# Patient Record
Sex: Male | Born: 1975 | Race: White | Hispanic: No | Marital: Married | Smoking: Former smoker
Health system: Southern US, Community
[De-identification: ages and names within clinical notes are randomized; demographics above are authoritative.]

## PROBLEM LIST (undated history)

## (undated) DIAGNOSIS — E119 Type 2 diabetes mellitus without complications: Secondary | ICD-10-CM

## (undated) DIAGNOSIS — I10 Essential (primary) hypertension: Secondary | ICD-10-CM

## (undated) HISTORY — DX: Type 2 diabetes mellitus without complications: E11.9

## (undated) HISTORY — PX: WISDOM TOOTH EXTRACTION: SHX21

## (undated) HISTORY — DX: Essential (primary) hypertension: I10

## (undated) HISTORY — PX: WRIST SURGERY: SHX841

---

## 1998-11-07 ENCOUNTER — Emergency Department (HOSPITAL_COMMUNITY): Admission: EM | Admit: 1998-11-07 | Discharge: 1998-11-07 | Payer: Self-pay | Admitting: Emergency Medicine

## 2013-09-11 ENCOUNTER — Ambulatory Visit (INDEPENDENT_AMBULATORY_CARE_PROVIDER_SITE_OTHER): Payer: BC Managed Care – PPO | Admitting: Internal Medicine

## 2013-09-11 VITALS — BP 154/80 | HR 79 | Temp 98.1°F | Resp 16 | Ht 70.0 in | Wt 219.4 lb

## 2013-09-11 DIAGNOSIS — IMO0002 Reserved for concepts with insufficient information to code with codable children: Secondary | ICD-10-CM

## 2013-09-11 DIAGNOSIS — T148 Other injury of unspecified body region: Secondary | ICD-10-CM

## 2013-09-11 DIAGNOSIS — W57XXXA Bitten or stung by nonvenomous insect and other nonvenomous arthropods, initial encounter: Secondary | ICD-10-CM

## 2013-09-11 DIAGNOSIS — L02412 Cutaneous abscess of left axilla: Secondary | ICD-10-CM

## 2013-09-11 MED ORDER — FLUOCINONIDE 0.05 % EX CREA: 1.0000 "application " | TOPICAL_CREAM | Freq: Two times a day (BID) | CUTANEOUS | Status: DC

## 2013-09-11 MED ORDER — DOXYCYCLINE HYCLATE 100 MG PO TABS: 100.0000 mg | ORAL_TABLET | Freq: Two times a day (BID) | ORAL | Status: DC

## 2013-09-11 NOTE — Progress Notes (Signed)
   Subjective:    Patient ID: TAEDYN GLASSCOCK, male    DOB: 1975-02-10, 38 y.o.   MRN: [id number] This chart was scribed for Tami Lin, MD by Randa Evens, ED Scribe. This Patient was seen in room 02 and the patients care was started at 4:58 PM  HPI  Chief Complaint  Patient presents with  . Rash    pt noticed possible chicken pox this morning;  covering stomach, left leg and right arm  . Insect Bite    x2 months ago;  pt states he believe he has an insect bite under his left armpit/side;  redness/tender to the touch   HPI Comments: KENDON SEDENO is a 38 y.o. male who presents to the [facility] complaining of rash onset 1 day prior. The rash is located on his left foot and abdomen.  He states that he believes that the rash may be chicken pox. He describes the rash as itchy. He states that he has recently been in the woods and in his garden. Denies fever.   Also complaining of insect bite to his left upper back several months prior. He states that it was infected initially and his wife drained it and it resolved. He states that the abscess has recently returned. He states he has stabbed it with a needle with no relief.   There are no active problems to display for this patient.  Prior to Admission medications   Not on File   Review of Systems  Constitutional: Negative for fever.  Skin: Positive for rash.    Objective:    Physical Exam  Nursing note and vitals reviewed. Constitutional: He is oriented to person, place, and time. He appears well-developed and well-nourished. No distress.  HENT:  Head: Normocephalic and atraumatic.  Eyes: Conjunctivae and EOM are normal.  Neck: Neck supple.  Cardiovascular: Normal rate.   Pulmonary/Chest: Effort normal. No respiratory distress.  Musculoskeletal: Normal range of motion.  Neurological: He is alert and oriented to person, place, and time.  Skin: Skin is warm and dry. Rash noted. Rash is vesicular.   Multiple vesicular lesion scattered over periumbilical and left ankle without any scabs or erythema,  Under left axilla 1x2cm abscess that is tender  Psychiatric: He has a normal mood and affect. His behavior is normal.   Procedure-open the abscess with an 18-gauge needle and drained pus and blood obtaining a culture Assessment & Plan:   Abscess of axilla, left - Plan: Wound culture  Insect bites  Chiggers  Meds ordered this encounter  Medications  . doxycycline (VIBRA-TABS) 100 MG tablet    Sig: Take 1 tablet (100 mg total) by mouth 2 (two) times daily.    Dispense:  20 tablet    Refill:  0  . fluocinonide cream (LIDEX) 0.05 %    Sig: Apply 1 application topically 2 (two) times daily.    Dispense:  30 g    Refill:  0       I have completed the patient encounter in its entirety as documented by the scribe, with editing by me where necessary. Cozetta Seif P. Laney Pastor, M.D.

## 2013-09-14 LAB — WOUND CULTURE
GRAM STAIN: NONE SEEN
Gram Stain: NONE SEEN

## 2013-09-27 ENCOUNTER — Telehealth: Payer: Self-pay

## 2013-09-27 NOTE — Telephone Encounter (Signed)
Patient finished his antibiotic medication for an insect bite from 09/11/13. Per patient his left arm seem to be getting better but today he noticed it was red again and swelling. Patient feels he needs another round of antibiotics to completely clear it up. Patient requesting Dr. Laney Pastor to call it in to [facility] on [address]. Patients call back number is [phone]

## 2013-09-28 ENCOUNTER — Ambulatory Visit (INDEPENDENT_AMBULATORY_CARE_PROVIDER_SITE_OTHER): Payer: BC Managed Care – PPO | Admitting: Internal Medicine

## 2013-09-28 ENCOUNTER — Telehealth: Payer: Self-pay

## 2013-09-28 VITALS — BP 146/100 | HR 78 | Temp 98.3°F | Resp 16 | Ht 70.5 in | Wt 223.8 lb

## 2013-09-28 DIAGNOSIS — L02412 Cutaneous abscess of left axilla: Secondary | ICD-10-CM

## 2013-09-28 DIAGNOSIS — I10 Essential (primary) hypertension: Secondary | ICD-10-CM | POA: Insufficient documentation

## 2013-09-28 DIAGNOSIS — IMO0002 Reserved for concepts with insufficient information to code with codable children: Secondary | ICD-10-CM

## 2013-09-28 DIAGNOSIS — Z6831 Body mass index (BMI) 31.0-31.9, adult: Secondary | ICD-10-CM

## 2013-09-28 MED ORDER — HYDROCHLOROTHIAZIDE 12.5 MG PO CAPS: 12.5000 mg | ORAL_CAPSULE | Freq: Every day | ORAL | Status: DC

## 2013-09-28 MED ORDER — SULFAMETHOXAZOLE-TMP DS 800-160 MG PO TABS: 1.0000 | ORAL_TABLET | Freq: Two times a day (BID) | ORAL | Status: DC

## 2013-09-28 NOTE — Progress Notes (Signed)
   Subjective:    Patient ID: Jesus Roy, male    DOB: February 22, 1975, 38 y.o.   MRN: [id number]  HPI  Jesus Roy is a 38 y.o. male who presents to [facility] for a follow-up to a suspected abscess of his left distal axilla.   Jesus Roy was treated with doxycyline 18 days ago on the 10th of August for an abscess; the culture with unrevealing. He responded to the doxycycline, but he finished the course a week ago and he is now relapsing. He reports the redness associated with the site had greatly improved prior to worsening. The affected site is also associated with pain and swelling.   The pt denies a spider bite or foreign body puncture.   He voices an allergy to penicillin.   When asked about the presence of hypertension on today's visit, Jesus Roy reports he measures his BP at work, and he states the BP is often high. In the office his BP is 146/100 today. He does not take medication to treat any HTN. He endorses early-morning headaches. His father has significant hypertension He has no chest pain palpitations shortness of breath or edema  He is a current every day smoker  Review of Systems  Constitutional: Negative for fever and chills.   all other systems are Otherwise negative, see HPI.      Objective:   Physical Exam  Nursing note and vitals reviewed. Constitutional: He is oriented to person, place, and time. He appears well-developed and well-nourished. No distress.  HENT:  Head: Normocephalic and atraumatic.  Eyes: Conjunctivae and EOM are normal.  Neck: Neck supple. No tracheal deviation present.  Cardiovascular: Normal rate.  Regular without murmur.  Pulmonary/Chest: Effort normal. No respiratory distress.  Musculoskeletal: Normal range of motion. No peripheral edema.  Neurological: He is alert and oriented to person, place, and time.  Skin: Skin is warm and dry. There is a 1 cm erythematous nodule with central purple discoloration and slight fluctuance.   Psychiatric: He has a normal mood and affect. His behavior is normal.  Vitals: BP 146/100  Pulse 78  Temp(Src) 98.3 F (36.8 C) (Oral)  Resp 16  Ht 5' 10.5" (1.791 m)  Wt 223 lb 12.8 oz (101.515 kg)  BMI 31.65 kg/m2  SpO2 98%      Assessment & Plan:  Will prescribe antibiotic and HTN medication.  I have completed the patient encounter in its entirety as documented by the scribe, with editing by me where necessary. Khair Chasteen P. Laney Pastor, M.D. Abscess of axilla, left  Relapse after partial response to treatment  Will treat with hot compresses and one month of Septra/original culture with staph but not MRSA Essential hypertension  New diagnosis BMI 31.0-31.9,adult  Wt loss/salt restr Nicotine addiction  We will work on cessation plan at followup Meds ordered this encounter  Medications  . sulfamethoxazole-trimethoprim (BACTRIM DS) 800-160 MG per tablet    Sig: Take 1 tablet by mouth 2 (two) times daily.    Dispense:  60 tablet    Refill:  0  . hydrochlorothiazide (MICROZIDE) 12.5 MG capsule////continue home blood pressures     Sig: Take 1 capsule (12.5 mg total) by mouth daily.    Dispense:  90 capsule    Refill:  0   Cpe/htn f/u 3 mos 104

## 2013-09-28 NOTE — Telephone Encounter (Signed)
Duplicate phone message 

## 2013-09-28 NOTE — Telephone Encounter (Signed)
Pt called in again concerned about his bite still being infected. He would like someone to call him back advising him what he needs to do. [phone] Thank you

## 2013-09-28 NOTE — Telephone Encounter (Signed)
Spoke to pt advised he needed to RTC for re evaluation

## 2013-10-26 ENCOUNTER — Telehealth: Payer: Self-pay

## 2013-10-26 NOTE — Telephone Encounter (Signed)
Pt advised to RTC.

## 2013-10-26 NOTE — Telephone Encounter (Signed)
Pt was here 09/28/13 and was prescribed an antibiotic and stopped taking them before they were gone. Then he thought the infection was coming back so he started taking the medication again and had what he thinks is an allergic reaction to the antibiotics. He wants someone to call him please!

## 2013-12-26 ENCOUNTER — Encounter: Payer: Self-pay | Admitting: Internal Medicine

## 2013-12-31 ENCOUNTER — Ambulatory Visit (INDEPENDENT_AMBULATORY_CARE_PROVIDER_SITE_OTHER): Payer: BC Managed Care – PPO | Admitting: Internal Medicine

## 2013-12-31 ENCOUNTER — Encounter: Payer: Self-pay | Admitting: Internal Medicine

## 2013-12-31 VITALS — BP 140/90 | HR 85 | Resp 12 | Ht 70.5 in | Wt 227.0 lb

## 2013-12-31 DIAGNOSIS — I10 Essential (primary) hypertension: Secondary | ICD-10-CM

## 2013-12-31 DIAGNOSIS — Z0000 Encounter for general adult medical examination without abnormal findings: Secondary | ICD-10-CM

## 2013-12-31 DIAGNOSIS — Z6832 Body mass index (BMI) 32.0-32.9, adult: Secondary | ICD-10-CM

## 2013-12-31 DIAGNOSIS — Z720 Tobacco use: Secondary | ICD-10-CM

## 2013-12-31 DIAGNOSIS — Z87891 Personal history of nicotine dependence: Secondary | ICD-10-CM

## 2013-12-31 DIAGNOSIS — E669 Obesity, unspecified: Secondary | ICD-10-CM

## 2013-12-31 DIAGNOSIS — E781 Pure hyperglyceridemia: Secondary | ICD-10-CM

## 2013-12-31 LAB — POCT URINALYSIS DIPSTICK
BILIRUBIN UA: NEGATIVE
Ketones, UA: NEGATIVE
Leukocytes, UA: NEGATIVE
Nitrite, UA: NEGATIVE
Spec Grav, UA: >=1.03
Urobilinogen, UA: NEGATIVE
pH, UA: 6

## 2013-12-31 MED ORDER — HYDROCHLOROTHIAZIDE 25 MG PO TABS: 25.0000 mg | ORAL_TABLET | Freq: Every day | ORAL | Status: DC

## 2013-12-31 MED ORDER — LOSARTAN POTASSIUM 50 MG PO TABS: 50.0000 mg | ORAL_TABLET | Freq: Every day | ORAL | Status: DC

## 2013-12-31 NOTE — Patient Instructions (Addendum)
Increase HCTZ to 25 mg daily and Losartan 50 mg daily. RTC in 3 weeks. He will need a basic metabolic panel with that time and blood pressure check with office visit. He says he will get flu vaccine at work.

## 2013-12-31 NOTE — Progress Notes (Signed)
   Subjective:    Patient ID: Jesus Roy, male    DOB: 04-Jan-1976, 38 y.o.   MRN: [id number]  HPI  38 year old White Male not seen here since 2008 with history of allergic rhinitis and attention deficit disorder who was seen at [facility] by Dr. Laney Pastor in August. He was diagnosed with hypertension and was put on HCTZ 12.5 mg daily which he just ran out of today. Patient is here for follow-up. His blood pressure is still not well controlled. In 2008 his blood pressure was 120/80 and he weighed 195 pounds. At that time he was taking Adderall XR 20 mg daily for attention deficit. He says insurance subsequently refused to pay for Adderall. He would like to get back on it but I'm reluctant to start him back on it with history of hypertension.  Past medical history: He is allergic penicillin causes a rash. He had a fractured left radial head in 1992, ganglion cyst removed from left wrist 2005, left foot surgery by podiatrist in February 1999.  Family history: Father with history of hypertension and hyperlipidemia. One brother in good health. Mother in good health.  Social history: He is married. He works at [employer] in [employer]. Doesn't get a lot of physical exercise. He has smoked for 15 years. Denies alcohol consumption. One stepson age 62.    Review of Systems  Constitutional: Negative.   All other systems reviewed and are negative.      Objective:   Physical Exam  Constitutional: He is oriented to person, place, and time. He appears well-developed and well-nourished. He appears distressed.  HENT:  Head: Normocephalic and atraumatic.  Right Ear: External ear normal.  Left Ear: External ear normal.  Mouth/Throat: Oropharynx is clear and moist. No oropharyngeal exudate.  Eyes: Conjunctivae and EOM are normal. Pupils are equal, round, and reactive to light. Right eye exhibits no discharge. Left eye exhibits no discharge. No scleral icterus.  Neck:  Neck supple. No JVD present. No thyromegaly present.  Cardiovascular: Normal rate, normal heart sounds and intact distal pulses.   No murmur heard. Pulmonary/Chest: Effort normal. He has no wheezes. He has no rales.  Abdominal: Soft. He exhibits no mass. There is no tenderness. There is no rebound and no guarding.  Genitourinary:  Testicles without masses. No hernias.  Musculoskeletal: He exhibits no edema.  Lymphadenopathy:    He has no cervical adenopathy.  Neurological: He is alert and oriented to person, place, and time. He has normal reflexes. No cranial nerve deficit.  Skin: Skin is warm and dry. No rash noted. He is not diaphoretic.  Multiple tattoos on both arms and back  Psychiatric: He has a normal mood and affect. His behavior is normal. Judgment normal.  Vitals reviewed.         Assessment & Plan:  Essential hypertension  History of smoking  Plan: Increase HCTZ to 25 mg daily. Start losartan 50 mg daily. Return in 3 weeks at which time he'll need office visit blood pressure check and basic metabolic panel. He did have a health screening at work this past year. Total cholesterol was 203 with an HDL cholesterol of 46, triglycerides of 195 and an LDL cholesterol of 118. Glucose was normal at 94. Blood pressure was 128/86. BMI was 32.1. Waist measurement was 43 inches. Patient was advised to diet exercise and lose 20 pounds. He needs to quit smoking.

## 2014-01-22 ENCOUNTER — Encounter: Payer: Self-pay | Admitting: Internal Medicine

## 2014-01-22 ENCOUNTER — Ambulatory Visit (INDEPENDENT_AMBULATORY_CARE_PROVIDER_SITE_OTHER): Payer: BC Managed Care – PPO | Admitting: Internal Medicine

## 2014-01-22 VITALS — BP 108/80 | HR 73 | Temp 97.6°F | Wt 234.0 lb

## 2014-01-22 DIAGNOSIS — Z0000 Encounter for general adult medical examination without abnormal findings: Secondary | ICD-10-CM

## 2014-01-22 DIAGNOSIS — Z0189 Encounter for other specified special examinations: Secondary | ICD-10-CM

## 2014-01-22 LAB — BASIC METABOLIC PANEL
BUN: 15 mg/dL (ref 6–23)
CALCIUM: 9.7 mg/dL (ref 8.4–10.5)
CHLORIDE: 102 meq/L (ref 96–112)
CO2: 29 meq/L (ref 19–32)
CREATININE: 0.85 mg/dL (ref 0.50–1.35)
Glucose, Bld: 125 mg/dL — ABNORMAL HIGH (ref 70–99)
Potassium: 4.3 mEq/L (ref 3.5–5.3)
Sodium: 138 mEq/L (ref 135–145)

## 2014-01-23 ENCOUNTER — Other Ambulatory Visit: Payer: Self-pay | Admitting: Internal Medicine

## 2014-01-24 ENCOUNTER — Other Ambulatory Visit: Payer: Self-pay | Admitting: *Deleted

## 2014-01-24 MED ORDER — HYDROCHLOROTHIAZIDE 25 MG PO TABS: 25.0000 mg | ORAL_TABLET | Freq: Every day | ORAL | Status: DC

## 2014-01-24 MED ORDER — LOSARTAN POTASSIUM 50 MG PO TABS: 50.0000 mg | ORAL_TABLET | Freq: Every day | ORAL | Status: DC

## 2014-01-24 NOTE — Telephone Encounter (Signed)
Patient called states his pharmacy states they have not received refills on his medications sent authorization in on both of his BP meds

## 2014-03-27 ENCOUNTER — Encounter: Payer: Self-pay | Admitting: Internal Medicine

## 2014-03-27 NOTE — Patient Instructions (Signed)
Continue HCTZ and Cozaar 50 mg daily. Return in 6 months. Quit smoking.

## 2014-03-27 NOTE — Progress Notes (Signed)
   Subjective:    Patient ID: Jesus Roy, male    DOB: April 16, 1975, 40 y.o.   MRN: [id number]  HPI  He was here in late November for physical exam and evaluation of hypertension. At that time he was on mild dose of HCTZ 12.5 mg daily. This was increased to 25 mg daily and he was started on losartan 50 mg daily. Blood pressure has improved considerably with this regimen. He feels well with no complaints. Basic metabolic panel drawn today. He also has a history of hypertriglyceridemia diagnosed last visit has been told to diet exercise and lose weight.   Review of Systems     Objective:   Physical Exam  Skin warm and dry. Nodes none. Neck is supple without JVD thyromegaly or carotid bruits. Chest clear to auscultation. Extremities without edema      Assessment & Plan:   Essential hypertension  Hypertriglyceridemia  History of smoking-counseled regarding quitting  Plan: Continue HCTZ 25 mg and losartan 50 mg daily. Return in 6 months.

## 2014-05-13 ENCOUNTER — Ambulatory Visit (INDEPENDENT_AMBULATORY_CARE_PROVIDER_SITE_OTHER): Payer: BLUE CROSS/BLUE SHIELD | Admitting: Internal Medicine

## 2014-05-13 ENCOUNTER — Encounter: Payer: Self-pay | Admitting: Internal Medicine

## 2014-05-13 DIAGNOSIS — R1010 Upper abdominal pain, unspecified: Secondary | ICD-10-CM | POA: Diagnosis not present

## 2014-05-13 LAB — AMYLASE: Amylase: 20 U/L (ref 0–105)

## 2014-05-13 LAB — CBC WITH DIFFERENTIAL/PLATELET
HEMATOCRIT: 48 % (ref 39.0–52.0)
HEMOGLOBIN: 16.5 g/dL (ref 13.0–17.0)
LYMPHS ABS: 3.4 10*3/uL (ref 0.7–4.0)
Lymphocytes Relative: 40 % (ref 12–46)
MCH: 28.7 pg (ref 26.0–34.0)
MCHC: 34.4 g/dL (ref 30.0–36.0)
MCV: 83.6 fL (ref 78.0–100.0)
MONO ABS: 0.5 10*3/uL (ref 0.1–1.0)
MONOS PCT: 6 % (ref 3–12)
NEUTROS PCT: 55 % (ref 43–77)
Neutro Abs: 4.7 10*3/uL (ref 1.7–7.7)
Platelets: 302 10*3/uL (ref 150–400)
RBC: 5.74 MIL/uL (ref 4.22–5.81)
RDW: 12.9 % (ref 11.5–15.5)
WBC: 8.5 10*3/uL (ref 4.0–10.5)

## 2014-05-13 LAB — POCT URINALYSIS DIPSTICK
Bilirubin, UA: NEGATIVE
Blood, UA: NEGATIVE
Glucose, UA: NEGATIVE
KETONES UA: NEGATIVE
LEUKOCYTES UA: NEGATIVE
Nitrite, UA: NEGATIVE
PROTEIN UA: NEGATIVE
SPEC GRAV UA: 1.015
Urobilinogen, UA: NEGATIVE
pH, UA: 6.5

## 2014-05-13 LAB — LIPASE: LIPASE: 22 U/L (ref 0–75)

## 2014-05-13 LAB — HEPATIC FUNCTION PANEL
ALBUMIN: 4.4 g/dL (ref 3.5–5.2)
ALT: 60 U/L — ABNORMAL HIGH (ref 0–53)
AST: 35 U/L (ref 0–37)
Alkaline Phosphatase: 90 U/L (ref 39–117)
Bilirubin, Direct: 0.1 mg/dL (ref 0.0–0.3)
Indirect Bilirubin: 0.4 mg/dL (ref 0.2–1.2)
TOTAL PROTEIN: 6.9 g/dL (ref 6.0–8.3)
Total Bilirubin: 0.5 mg/dL (ref 0.2–1.2)

## 2014-05-13 MED ORDER — MELOXICAM 15 MG PO TABS: 15.0000 mg | ORAL_TABLET | Freq: Every day | ORAL | Status: DC

## 2014-05-13 NOTE — Progress Notes (Signed)
   Subjective:    Patient ID: Jesus Roy, male    DOB: [birth year], 39 y.o.   MRN: [id number]  HPI  Onset Friday of right lateral abdominal pain. This is to the right of the upper quadrant but not the axillary line. No vomiting fever or chills or nausea. No diarrhea. Patient doesn't recall anything other than regular activity when he had onset of pain. Noted pain when he was coughing or laughing but no other time. He took some Advil and got some relief. On Saturday he was fine. However yesterday he mowed the yard and had recurrence of the pain. Said wife was concerned about gallbladder attack.  He is overweight and has a history of hypertension. He has a history of smoking.    Review of Systems     Objective:   Physical Exam  He has palpable tenderness along the right lateral lower rib cage area. There is point tenderness. He has no rebound tenderness in the right upper quadrant. Abdomen is soft and nondistended without hepatosplenomegaly. Urinalysis is unremarkable. CBC with differential, liver functions, amylase and lipase pending. Chest is clear. Pulse oximetry is normal at 98%.      Assessment & Plan:  Probable musculoskeletal pain right rib cage area lateral aspect  Plan: Mobic 15 mg daily for 7-10 days. Call if symptoms worsen or do not improve.  25 minutes spent with patient.

## 2014-05-13 NOTE — Patient Instructions (Signed)
Suspect this is musculoskeletal pain since it occurs with coughing and laughing. Take Mobitz 15 mg daily for 7-10 days. Call if symptoms worsen. Have checked liver functions, amylase and lipase in addition to CBC with differential.

## 2014-05-14 ENCOUNTER — Telehealth: Payer: Self-pay | Admitting: *Deleted

## 2014-05-14 NOTE — Telephone Encounter (Signed)
Reviewed lab results with patient.

## 2014-07-26 ENCOUNTER — Ambulatory Visit: Payer: BC Managed Care – PPO | Admitting: Internal Medicine

## 2014-08-01 ENCOUNTER — Ambulatory Visit (INDEPENDENT_AMBULATORY_CARE_PROVIDER_SITE_OTHER): Payer: BLUE CROSS/BLUE SHIELD | Admitting: Internal Medicine

## 2014-08-01 ENCOUNTER — Encounter: Payer: Self-pay | Admitting: Internal Medicine

## 2014-08-01 VITALS — BP 132/84 | HR 92 | Temp 97.2°F | Wt 228.0 lb

## 2014-08-01 DIAGNOSIS — Z23 Encounter for immunization: Secondary | ICD-10-CM

## 2014-08-01 DIAGNOSIS — Z283 Underimmunization status: Secondary | ICD-10-CM | POA: Diagnosis not present

## 2014-08-01 DIAGNOSIS — Z2839 Other underimmunization status: Secondary | ICD-10-CM

## 2014-08-01 DIAGNOSIS — Z0000 Encounter for general adult medical examination without abnormal findings: Secondary | ICD-10-CM | POA: Diagnosis not present

## 2014-08-01 DIAGNOSIS — I10 Essential (primary) hypertension: Secondary | ICD-10-CM | POA: Diagnosis not present

## 2014-08-01 MED ORDER — LOSARTAN POTASSIUM-HCTZ 100-25 MG PO TABS: 1.0000 | ORAL_TABLET | Freq: Every day | ORAL | Status: DC

## 2014-08-01 NOTE — Progress Notes (Signed)
   Subjective:    Patient ID: Jesus Roy, male    DOB: [birth year], 39 y.o.   MRN: [id number]  HPI Six-month recheck on hypertension. He needs tetanus immunization update. He's been taking losartan 50 mg daily and HCTZ 25 mg daily. He says drugstore changed brands of losartan and interestingly enough his legs do not hurt anymore when he started the new prescription. I'm not sure why that is. He is certain it was losartan causing leg cramps not HCTZ. His blood pressure is under fairly good control but I would  like to see it closer to 120/80.    Review of Systems     Objective:   Physical Exam  Skin warm and dry. Nodes none. Neck is supple without JVD thyromegaly or carotid bruits. Chest clear to auscultation. Cardiac exam regular rate and rhythm normal S1 and S2. Extremities without edema      Assessment & Plan:  Essential hypertension  Have prescribed losartan HCTZ 100/25 . He will return July 28 for follow-up with office visit blood pressure check and basic metabolic panel.

## 2014-08-01 NOTE — Patient Instructions (Signed)
Change to losartan 100/25 daily. Tetanus immunization update given. Return in 4 weeks for follow-up and basic metabolic panel.

## 2014-08-29 ENCOUNTER — Ambulatory Visit (INDEPENDENT_AMBULATORY_CARE_PROVIDER_SITE_OTHER): Payer: BLUE CROSS/BLUE SHIELD | Admitting: Internal Medicine

## 2014-08-29 ENCOUNTER — Encounter: Payer: Self-pay | Admitting: Internal Medicine

## 2014-08-29 DIAGNOSIS — I10 Essential (primary) hypertension: Secondary | ICD-10-CM

## 2014-08-29 MED ORDER — LOSARTAN POTASSIUM-HCTZ 100-25 MG PO TABS: 1.0000 | ORAL_TABLET | Freq: Every day | ORAL | Status: DC

## 2014-08-29 NOTE — Progress Notes (Signed)
   Subjective:    Patient ID: Jesus Roy, male    DOB: 1975/08/19, 39 y.o.   MRN: [id number]  HPI  In June, he was here for six-month recheck on hypertension. Blood pressure was reasonably well controlled on losartan 50/25 at 132/84 but wanted to see a little bit better control son increased him to losartan 100/25 daily. He's been taking it without any difficulty whatsoever. Basic metabolic panel checked today. Has been monitoring blood pressure at work and says it's running in the 485I systolically and in the 62V diastolically.    Review of Systems     Objective:   Physical Exam  Skin warm and dry. Nodes none. No JVD thyromegaly or carotid bruits. Chest clear to auscultation. Cardiac exam regular rate and rhythm. Extremities without edema.  Family history of hypertension in his father      Assessment & Plan:  Essential hypertension  Plan: Continue losartan 100/50 daily and reevaluate February 2017. Will be due for physical exam at that time. He is to continue to monitor his blood pressure at work.

## 2014-08-29 NOTE — Patient Instructions (Signed)
Continue losartan 100/25 and return in early February for physical exam

## 2014-08-30 ENCOUNTER — Telehealth: Payer: Self-pay | Admitting: *Deleted

## 2014-08-30 LAB — BASIC METABOLIC PANEL
BUN: 13 mg/dL (ref 7–25)
CALCIUM: 9.4 mg/dL (ref 8.6–10.3)
CO2: 20 mEq/L (ref 20–31)
Chloride: 105 mEq/L (ref 98–110)
Creat: 0.74 mg/dL (ref 0.60–1.35)
Glucose, Bld: 112 mg/dL — ABNORMAL HIGH (ref 65–99)
Potassium: 3.5 mEq/L (ref 3.5–5.3)
Sodium: 143 mEq/L (ref 135–146)

## 2014-08-30 MED ORDER — POTASSIUM CHLORIDE CRYS ER 20 MEQ PO TBCR: 20.0000 meq | EXTENDED_RELEASE_TABLET | Freq: Every day | ORAL | Status: DC

## 2014-08-30 NOTE — Telephone Encounter (Signed)
Spoke with patient regarding lab results. 

## 2014-09-23 ENCOUNTER — Other Ambulatory Visit: Payer: BLUE CROSS/BLUE SHIELD | Admitting: Internal Medicine

## 2014-09-23 DIAGNOSIS — I10 Essential (primary) hypertension: Secondary | ICD-10-CM

## 2014-09-23 LAB — BASIC METABOLIC PANEL
BUN: 14 mg/dL (ref 7–25)
CALCIUM: 9.4 mg/dL (ref 8.6–10.3)
CO2: 26 mmol/L (ref 20–31)
CREATININE: 0.88 mg/dL (ref 0.60–1.35)
Chloride: 101 mmol/L (ref 98–110)
GLUCOSE: 115 mg/dL — AB (ref 65–99)
Potassium: 4.2 mmol/L (ref 3.5–5.3)
SODIUM: 139 mmol/L (ref 135–146)

## 2014-10-28 ENCOUNTER — Other Ambulatory Visit: Payer: Self-pay | Admitting: *Deleted

## 2014-10-28 MED ORDER — POTASSIUM CHLORIDE CRYS ER 20 MEQ PO TBCR: 20.0000 meq | EXTENDED_RELEASE_TABLET | Freq: Every day | ORAL | Status: DC

## 2014-10-28 NOTE — Telephone Encounter (Signed)
K refilled

## 2015-02-28 ENCOUNTER — Other Ambulatory Visit: Payer: Self-pay | Admitting: Internal Medicine

## 2015-03-20 ENCOUNTER — Other Ambulatory Visit: Payer: BLUE CROSS/BLUE SHIELD | Admitting: Internal Medicine

## 2015-03-20 DIAGNOSIS — Z0000 Encounter for general adult medical examination without abnormal findings: Secondary | ICD-10-CM

## 2015-03-20 DIAGNOSIS — I10 Essential (primary) hypertension: Secondary | ICD-10-CM

## 2015-03-20 DIAGNOSIS — Z13220 Encounter for screening for lipoid disorders: Secondary | ICD-10-CM

## 2015-03-20 LAB — COMPLETE METABOLIC PANEL WITH GFR
ALT: 60 U/L — AB (ref 9–46)
AST: 31 U/L (ref 10–40)
Albumin: 4.3 g/dL (ref 3.6–5.1)
Alkaline Phosphatase: 109 U/L (ref 40–115)
BUN: 15 mg/dL (ref 7–25)
CO2: 24 mmol/L (ref 20–31)
CREATININE: 0.79 mg/dL (ref 0.60–1.35)
Calcium: 9.3 mg/dL (ref 8.6–10.3)
Chloride: 101 mmol/L (ref 98–110)
GFR, Est African American: >89 mL/min (ref >=60)
GFR, Est Non African American: >89 mL/min (ref >=60)
Glucose, Bld: 97 mg/dL (ref 65–99)
Potassium: 3.8 mmol/L (ref 3.5–5.3)
Sodium: 137 mmol/L (ref 135–146)
Total Bilirubin: 0.5 mg/dL (ref 0.2–1.2)
Total Protein: 6.7 g/dL (ref 6.1–8.1)

## 2015-03-20 LAB — LIPID PANEL
Cholesterol: 200 mg/dL (ref 125–200)
HDL: 39 mg/dL — ABNORMAL LOW (ref >=40)
LDL CALC: 129 mg/dL (ref <130)
Total CHOL/HDL Ratio: 5.1 Ratio — ABNORMAL HIGH (ref <=5.0)
Triglycerides: 161 mg/dL — ABNORMAL HIGH (ref <150)
VLDL: 32 mg/dL — AB (ref <30)

## 2015-03-20 LAB — CBC WITH DIFFERENTIAL/PLATELET
BASOS ABS: 0.1 10*3/uL (ref 0.0–0.1)
Basophils Relative: 1 % (ref 0–1)
EOS ABS: 0.1 10*3/uL (ref 0.0–0.7)
EOS PCT: 2 % (ref 0–5)
HCT: 46.2 % (ref 39.0–52.0)
Hemoglobin: 16.2 g/dL (ref 13.0–17.0)
Lymphocytes Relative: 37 % (ref 12–46)
Lymphs Abs: 2.6 10*3/uL (ref 0.7–4.0)
MCH: 29.1 pg (ref 26.0–34.0)
MCHC: 35.1 g/dL (ref 30.0–36.0)
MCV: 82.9 fL (ref 78.0–100.0)
MPV: 9.8 fL (ref 8.6–12.4)
Monocytes Absolute: 0.6 10*3/uL (ref 0.1–1.0)
Monocytes Relative: 8 % (ref 3–12)
Neutro Abs: 3.7 10*3/uL (ref 1.7–7.7)
Neutrophils Relative %: 52 % (ref 43–77)
PLATELETS: 295 10*3/uL (ref 150–400)
RBC: 5.57 MIL/uL (ref 4.22–5.81)
RDW: 13.7 % (ref 11.5–15.5)
WBC: 7.1 10*3/uL (ref 4.0–10.5)

## 2015-03-25 ENCOUNTER — Ambulatory Visit (INDEPENDENT_AMBULATORY_CARE_PROVIDER_SITE_OTHER): Payer: BLUE CROSS/BLUE SHIELD | Admitting: Internal Medicine

## 2015-03-25 ENCOUNTER — Encounter: Payer: Self-pay | Admitting: Internal Medicine

## 2015-03-25 VITALS — BP 124/84 | HR 89 | Temp 97.6°F | Resp 20 | Ht 71.0 in | Wt 233.0 lb

## 2015-03-25 DIAGNOSIS — E781 Pure hyperglyceridemia: Secondary | ICD-10-CM | POA: Diagnosis not present

## 2015-03-25 DIAGNOSIS — R740 Nonspecific elevation of levels of transaminase and lactic acid dehydrogenase [LDH]: Secondary | ICD-10-CM | POA: Diagnosis not present

## 2015-03-25 DIAGNOSIS — Z720 Tobacco use: Secondary | ICD-10-CM | POA: Diagnosis not present

## 2015-03-25 DIAGNOSIS — Z0000 Encounter for general adult medical examination without abnormal findings: Secondary | ICD-10-CM | POA: Diagnosis not present

## 2015-03-25 DIAGNOSIS — I10 Essential (primary) hypertension: Secondary | ICD-10-CM | POA: Diagnosis not present

## 2015-03-25 DIAGNOSIS — E669 Obesity, unspecified: Secondary | ICD-10-CM

## 2015-03-25 DIAGNOSIS — Z87891 Personal history of nicotine dependence: Secondary | ICD-10-CM

## 2015-03-25 DIAGNOSIS — G4700 Insomnia, unspecified: Secondary | ICD-10-CM

## 2015-03-25 DIAGNOSIS — R7401 Elevation of levels of liver transaminase levels: Secondary | ICD-10-CM

## 2015-03-25 LAB — POCT URINALYSIS DIPSTICK
BILIRUBIN UA: NEGATIVE
Blood, UA: NEGATIVE
GLUCOSE UA: NEGATIVE
Ketones, UA: NEGATIVE
LEUKOCYTES UA: NEGATIVE
NITRITE UA: NEGATIVE
Protein, UA: NEGATIVE
Spec Grav, UA: 1.02
Urobilinogen, UA: 0.2
pH, UA: 6

## 2015-03-25 MED ORDER — LOSARTAN POTASSIUM-HCTZ 100-25 MG PO TABS: 1.0000 | ORAL_TABLET | Freq: Every day | ORAL | Status: DC

## 2015-03-25 MED ORDER — POTASSIUM CHLORIDE CRYS ER 20 MEQ PO TBCR: 20.0000 meq | EXTENDED_RELEASE_TABLET | Freq: Once | ORAL | Status: DC

## 2015-03-25 MED ORDER — ALPRAZOLAM 0.5 MG PO TBDP: 0.5000 mg | ORAL_TABLET | ORAL | Status: DC

## 2015-04-26 NOTE — Patient Instructions (Addendum)
Would like patient work on diet exercise and weight loss. Return in 6 months for office visit blood pressure check and fasting lipid panel with liver functions. Continue same antihypertensive medications. Takes Xanax sparingly for insomnia. Please quit smoking.

## 2015-04-26 NOTE — Progress Notes (Signed)
Subjective:    Patient ID: Jesus Roy, male    DOB: 12-09-1975, 40 y.o.   MRN: [id number]  HPI 40 year old white male in today for health maintenance exam and evaluation of medical issues. He has a history of allergic rhinitis, attention deficit disorder and essential hypertension. [facility] Laney [facility] with hypertension. He was placed on HCTZ 12.5 mg daily and subsequently we increase that to 25 mg daily and placed him on Cozaar as well. I did not restart Adderall because of his hypertension.  Past medical history: Fractured left radial head in 1992, ganglion cyst removed from left wrist 2005, left foot surgery by podiatrist February 1999.  He is allergic to Penicillin- it causes a rash.  Family history: Father with history of hypertension and hyperlipidemia. One brother in good health. Mother in good health.  Social history: He is married. He works at [employer] in [employer]. Doesn't get a lot of physical exercise. His smoke for 15 years. Denies alcohol consumption. One stepson age 67.    Review of Systems has some occasional insomnia. Have provided him with small prescription of Xanax     Objective:   Physical Exam  Constitutional: He is oriented to person, place, and time. He appears well-developed and well-nourished. No distress.  HENT:  Head: Normocephalic and atraumatic.  Right Ear: External ear normal.  Left Ear: External ear normal.  Mouth/Throat: Oropharynx is clear and moist. No oropharyngeal exudate.  Eyes: Conjunctivae are normal. Pupils are equal, round, and reactive to light. Right eye exhibits no discharge. Left eye exhibits no discharge. No scleral icterus.  Neck: Neck supple. No JVD present. No thyromegaly present.  Cardiovascular: Normal rate, regular rhythm, normal heart sounds and intact distal pulses.   No murmur heard. Pulmonary/Chest: Effort normal and breath sounds normal. No respiratory  distress. He has no wheezes. He has no rales. He exhibits no tenderness.  Abdominal: Soft. Bowel sounds are normal. He exhibits no distension and no mass. There is no tenderness. There is no rebound and no guarding.  Genitourinary:  No hernia to direct palpation  Musculoskeletal: He exhibits no edema.  Lymphadenopathy:    He has no cervical adenopathy.  Neurological: He is alert and oriented to person, place, and time. He has normal reflexes. No cranial nerve deficit. Coordination normal.  Skin: Skin is warm and dry. No rash noted. He is not diaphoretic.  Psychiatric: He has a normal mood and affect. His behavior is normal. Judgment and thought content normal.  Vitals reviewed.         Assessment & Plan:  Essential hypertension-stable on current regimen of losartan/HCTZ 100/25. Continue same medication.  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 Needs to work on diet and exercise. Likely has fatty liver. Recheck in 6 months  History smoking-needs to quit  Insomnia-small prescription of Xanax to take sparingly for insomnia on a when necessary basis only  History attention deficit disorder-currently not on medication  Plan: Return in 6 months for follow-up will need fasting lipid panel, and C met to follow-up on elevated SGPT, potassium, lipids in addition to blood pressure check. Work on diet exercise and weight loss.

## 2015-06-25 ENCOUNTER — Telehealth: Payer: Self-pay | Admitting: Internal Medicine

## 2015-06-25 NOTE — Telephone Encounter (Signed)
Give one refill. Does not want dissolvable tablet

## 2015-06-25 NOTE — Telephone Encounter (Signed)
Calling to get a refill on his Xanax .0.5 mg.  However, can he get the normal instead of the dissolvable pill?  He was trying this from his visit in February.  And, states this does help him with his sleep prn.  He would like to get a refill please.    Thank you.

## 2015-06-26 ENCOUNTER — Other Ambulatory Visit: Payer: Self-pay

## 2015-06-26 MED ORDER — ALPRAZOLAM 0.5 MG PO TABS: 0.5000 mg | ORAL_TABLET | Freq: Every evening | ORAL | Status: DC | PRN

## 2015-09-25 ENCOUNTER — Encounter: Payer: Self-pay | Admitting: Internal Medicine

## 2015-09-25 ENCOUNTER — Ambulatory Visit (INDEPENDENT_AMBULATORY_CARE_PROVIDER_SITE_OTHER): Payer: BLUE CROSS/BLUE SHIELD | Admitting: Internal Medicine

## 2015-09-25 VITALS — BP 132/76 | HR 86 | Temp 98.0°F | Ht 71.0 in | Wt 227.0 lb

## 2015-09-25 DIAGNOSIS — Z87891 Personal history of nicotine dependence: Secondary | ICD-10-CM

## 2015-09-25 DIAGNOSIS — Z720 Tobacco use: Secondary | ICD-10-CM | POA: Diagnosis not present

## 2015-09-25 DIAGNOSIS — G4700 Insomnia, unspecified: Secondary | ICD-10-CM

## 2015-09-25 DIAGNOSIS — E669 Obesity, unspecified: Secondary | ICD-10-CM | POA: Diagnosis not present

## 2015-09-25 DIAGNOSIS — I10 Essential (primary) hypertension: Secondary | ICD-10-CM | POA: Diagnosis not present

## 2015-09-25 MED ORDER — POTASSIUM CHLORIDE CRYS ER 20 MEQ PO TBCR: 20.0000 meq | EXTENDED_RELEASE_TABLET | Freq: Once | ORAL | 1 refills | Status: DC

## 2015-09-25 MED ORDER — LOSARTAN POTASSIUM-HCTZ 100-25 MG PO TABS: 1.0000 | ORAL_TABLET | Freq: Every day | ORAL | 1 refills | Status: DC

## 2015-09-25 MED ORDER — ALPRAZOLAM 0.5 MG PO TABS: 0.5000 mg | ORAL_TABLET | Freq: Every evening | ORAL | 1 refills | Status: DC | PRN

## 2015-09-25 NOTE — Progress Notes (Signed)
   Subjective:    Patient ID: Jesus Roy, male    DOB: 1976-01-27, 40 y.o.   MRN: [id number]  HPI 40  year old Male with HTN for 6 month recheck.Continues to smoke. Not willing to quit. History of hypertension and insomnia. Needs refill on  medications. No new complaints. Remains overweight.Takes Xanax sparingly for sleep at times.    Review of Systems no new complaints     Objective:   Physical Exam  Skin warm and dry. Nodes none. Neck is supple without JVD thyromegaly or carotid bruits. Chest clear to auscultation. Cardiac exam: regular rate and rhythm. Extremities without edema      Assessment & Plan:  Essential hypertension-stable on current regimen  Insomnia-refill Xanax  History of smoking-not willing to quit  Obesity-continue diet and exercise efforts  Plan: Due for physical exam in February 2018.  25 minutes spent with patient.

## 2015-09-26 NOTE — Patient Instructions (Signed)
Please consider quitting smoking. Diet and exercise recommended. Continue same antihypertensive medication. Return in 6 months. Xanax refilled.

## 2016-03-22 ENCOUNTER — Other Ambulatory Visit: Payer: BLUE CROSS/BLUE SHIELD | Admitting: Internal Medicine

## 2016-03-24 ENCOUNTER — Telehealth: Payer: Self-pay | Admitting: Internal Medicine

## 2016-03-24 NOTE — Telephone Encounter (Signed)
Patient has cancelled his CPE for 03/25/16 and didn't R/S at this time.  Per Dr. Renold Genta, no refills without an appointment.

## 2016-03-25 ENCOUNTER — Encounter: Payer: BLUE CROSS/BLUE SHIELD | Admitting: Internal Medicine

## 2016-03-29 ENCOUNTER — Other Ambulatory Visit: Payer: Self-pay | Admitting: Internal Medicine

## 2016-05-24 ENCOUNTER — Other Ambulatory Visit: Payer: BLUE CROSS/BLUE SHIELD | Admitting: Internal Medicine

## 2016-05-24 DIAGNOSIS — Z0000 Encounter for general adult medical examination without abnormal findings: Secondary | ICD-10-CM

## 2016-05-24 DIAGNOSIS — E781 Pure hyperglyceridemia: Secondary | ICD-10-CM

## 2016-05-24 LAB — CBC WITH DIFFERENTIAL/PLATELET
BASOS PCT: 0 %
Basophils Absolute: 0 cells/uL (ref 0–200)
EOS ABS: 136 {cells}/uL (ref 15–500)
Eosinophils Relative: 2 %
HEMATOCRIT: 46.6 % (ref 38.5–50.0)
HEMOGLOBIN: 16.5 g/dL (ref 13.2–17.1)
LYMPHS ABS: 2516 {cells}/uL (ref 850–3900)
Lymphocytes Relative: 37 %
MCH: 29.3 pg (ref 27.0–33.0)
MCHC: 35.4 g/dL (ref 32.0–36.0)
MCV: 82.6 fL (ref 80.0–100.0)
MONO ABS: 544 {cells}/uL (ref 200–950)
MPV: 10 fL (ref 7.5–12.5)
Monocytes Relative: 8 %
Neutro Abs: 3604 cells/uL (ref 1500–7800)
Neutrophils Relative %: 53 %
Platelets: 280 10*3/uL (ref 140–400)
RBC: 5.64 MIL/uL (ref 4.20–5.80)
RDW: 13.7 % (ref 11.0–15.0)
WBC: 6.8 10*3/uL (ref 3.8–10.8)

## 2016-05-24 LAB — COMPREHENSIVE METABOLIC PANEL
ALBUMIN: 4.4 g/dL (ref 3.6–5.1)
ALK PHOS: 102 U/L (ref 40–115)
ALT: 50 U/L — AB (ref 9–46)
AST: 32 U/L (ref 10–40)
BILIRUBIN TOTAL: 0.8 mg/dL (ref 0.2–1.2)
BUN: 15 mg/dL (ref 7–25)
CALCIUM: 9.5 mg/dL (ref 8.6–10.3)
CO2: 27 mmol/L (ref 20–31)
Chloride: 102 mmol/L (ref 98–110)
Creat: 0.73 mg/dL (ref 0.60–1.35)
Glucose, Bld: 87 mg/dL (ref 65–99)
Potassium: 3.7 mmol/L (ref 3.5–5.3)
Sodium: 139 mmol/L (ref 135–146)
TOTAL PROTEIN: 6.9 g/dL (ref 6.1–8.1)

## 2016-05-24 LAB — LIPID PANEL
CHOLESTEROL: 205 mg/dL — AB (ref <200)
HDL: 43 mg/dL (ref >40)
LDL Cholesterol: 129 mg/dL — ABNORMAL HIGH (ref <100)
TRIGLYCERIDES: 166 mg/dL — AB (ref <150)
Total CHOL/HDL Ratio: 4.8 Ratio (ref <5.0)
VLDL: 33 mg/dL — ABNORMAL HIGH (ref <30)

## 2016-05-27 ENCOUNTER — Encounter: Payer: Self-pay | Admitting: Internal Medicine

## 2016-05-27 ENCOUNTER — Ambulatory Visit (INDEPENDENT_AMBULATORY_CARE_PROVIDER_SITE_OTHER): Payer: BLUE CROSS/BLUE SHIELD | Admitting: Internal Medicine

## 2016-05-27 VITALS — BP 130/90 | HR 95 | Temp 97.1°F | Ht 69.5 in | Wt 229.0 lb

## 2016-05-27 DIAGNOSIS — Z6832 Body mass index (BMI) 32.0-32.9, adult: Secondary | ICD-10-CM | POA: Diagnosis not present

## 2016-05-27 DIAGNOSIS — E7800 Pure hypercholesterolemia, unspecified: Secondary | ICD-10-CM

## 2016-05-27 DIAGNOSIS — Z0000 Encounter for general adult medical examination without abnormal findings: Secondary | ICD-10-CM | POA: Diagnosis not present

## 2016-05-27 DIAGNOSIS — Z87891 Personal history of nicotine dependence: Secondary | ICD-10-CM | POA: Diagnosis not present

## 2016-05-27 DIAGNOSIS — G4709 Other insomnia: Secondary | ICD-10-CM | POA: Diagnosis not present

## 2016-05-27 DIAGNOSIS — I10 Essential (primary) hypertension: Secondary | ICD-10-CM

## 2016-05-27 DIAGNOSIS — E781 Pure hyperglyceridemia: Secondary | ICD-10-CM | POA: Diagnosis not present

## 2016-05-27 LAB — POCT URINALYSIS DIPSTICK
BILIRUBIN UA: NEGATIVE
Glucose, UA: NEGATIVE
Ketones, UA: NEGATIVE
Leukocytes, UA: NEGATIVE
NITRITE UA: NEGATIVE
PH UA: 6 (ref 5.0–8.0)
Protein, UA: NEGATIVE
RBC UA: NEGATIVE
Spec Grav, UA: 1.025 (ref 1.010–1.025)
UROBILINOGEN UA: 0.2 U/dL

## 2016-05-27 MED ORDER — POTASSIUM CHLORIDE CRYS ER 20 MEQ PO TBCR
20.0000 meq | EXTENDED_RELEASE_TABLET | Freq: Every day | ORAL | 1 refills | Status: DC
Start: 2016-05-27 — End: 2016-12-10

## 2016-05-27 MED ORDER — ALPRAZOLAM 0.5 MG PO TABS: 0.5000 mg | ORAL_TABLET | Freq: Every evening | ORAL | 1 refills | Status: DC | PRN

## 2016-05-27 MED ORDER — ALPRAZOLAM 0.5 MG PO TABS: 0.5000 mg | ORAL_TABLET | Freq: Every evening | ORAL | 5 refills | Status: DC | PRN

## 2016-05-27 MED ORDER — LOSARTAN POTASSIUM-HCTZ 100-25 MG PO TABS: 1.0000 | ORAL_TABLET | Freq: Every day | ORAL | 0 refills | Status: DC

## 2016-05-29 NOTE — Patient Instructions (Signed)
Try to get more exercise and lose some weight. Continue same medications and return in 6 months. Please quit smoking.

## 2016-05-29 NOTE — Progress Notes (Signed)
   Subjective:    Patient ID: Jesus Roy, male    DOB: 1975-12-10, 41 y.o.   MRN: [id number]  HPI 41 year old male in today for health maintenance exam and evaluation of medical issues. He has history of allergic rhinitis, attention deficit disorder and essential hypertension. Was diagnosed by Dr. Laney [facility] August 2015 with hypertension. Currently on losartan HCTZ 100/25 daily. He does take a potassium supplement. Does take some alprazolam to sleep.  Past medical history: Fractured left radial head 1992, ganglion cyst removed from left wrist 2005, left foot surgery by podiatrist February 1999.  He is allergic to penicillin-causes a rash.  Family history: Father with history of hypertension and hyperlipidemia. One brother in good health. Mother in good health.  Social history: He is married. 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 Has smoked for 16 years. Denies alcohol consumption. One stepson age 51.    Review of Systems  Constitutional: Negative.   All other systems reviewed and are negative.  overweight-doesn't get a lot of physical exercise outside of work     Objective:   Physical Exam  Constitutional: He is oriented to person, place, and time. He appears well-developed and well-nourished. No distress.  HENT:  Head: Normocephalic and atraumatic.  Right Ear: External ear normal.  Left Ear: External ear normal.  Mouth/Throat: Oropharynx is clear and moist. No oropharyngeal exudate.  Eyes: Conjunctivae and EOM are normal. Pupils are equal, round, and reactive to light. Right eye exhibits no discharge. Left eye exhibits no discharge. No scleral icterus.  Neck: Neck supple. No JVD present. No thyromegaly present.  Cardiovascular: Normal rate, regular rhythm, normal heart sounds and intact distal pulses.   No murmur heard. Pulmonary/Chest: Breath sounds normal. No respiratory distress. He has no wheezes. He has no rales.  Abdominal: Soft.  Bowel sounds are normal. He exhibits no distension and no mass. There is no tenderness. There is no rebound and no guarding.  Genitourinary:  Genitourinary Comments: Deferred  Musculoskeletal: He exhibits no edema.  Lymphadenopathy:    He has no cervical adenopathy.  Neurological: He is oriented to person, place, and time. He has normal reflexes. Coordination normal.  Skin: Skin is warm and dry. No rash noted. He is not diaphoretic.  Psychiatric: He has a normal mood and affect. His behavior is normal. Judgment and thought content normal.  Vitals reviewed.         Assessment & Plan:  Essential hypertension-stable on current regimen  Insomnia-refill Xanax  Hypokalemia secondary to diuretic therapy-taking potassium supplement  History smoking-once again told him to quit  Hypertriglyceridemia-triglycerides 166  Mild elevation in LDL at 129  Elevated SGPT of 60 likely due to fatty liver  Plan: Return in 6 months for office visit blood pressure check ,lipid panel, C met

## 2016-11-23 ENCOUNTER — Other Ambulatory Visit: Payer: BLUE CROSS/BLUE SHIELD | Admitting: Internal Medicine

## 2016-11-26 ENCOUNTER — Ambulatory Visit: Payer: BLUE CROSS/BLUE SHIELD | Admitting: Internal Medicine

## 2016-11-29 ENCOUNTER — Other Ambulatory Visit: Payer: Self-pay | Admitting: Internal Medicine

## 2016-11-29 NOTE — Telephone Encounter (Signed)
Refill one time only-- has appt soon

## 2016-12-07 ENCOUNTER — Other Ambulatory Visit: Payer: BLUE CROSS/BLUE SHIELD | Admitting: Internal Medicine

## 2016-12-07 DIAGNOSIS — E781 Pure hyperglyceridemia: Secondary | ICD-10-CM

## 2016-12-07 LAB — HEPATIC FUNCTION PANEL
AG Ratio: 1.8 (calc) (ref 1.0–2.5)
ALKALINE PHOSPHATASE (APISO): 101 U/L (ref 40–115)
ALT: 34 U/L (ref 9–46)
AST: 21 U/L (ref 10–40)
Albumin: 4.1 g/dL (ref 3.6–5.1)
BILIRUBIN INDIRECT: 0.2 mg/dL (ref 0.2–1.2)
Bilirubin, Direct: 0.1 mg/dL (ref 0.0–0.2)
GLOBULIN: 2.3 g/dL (ref 1.9–3.7)
TOTAL PROTEIN: 6.4 g/dL (ref 6.1–8.1)
Total Bilirubin: 0.3 mg/dL (ref 0.2–1.2)

## 2016-12-07 LAB — LIPID PANEL
CHOL/HDL RATIO: 7.6 (calc) — AB (ref <5.0)
CHOLESTEROL: 205 mg/dL — AB (ref <200)
HDL: 27 mg/dL — AB (ref >40)
Non-HDL Cholesterol (Calc): 178 mg/dL (calc) — ABNORMAL HIGH (ref <130)
Triglycerides: 837 mg/dL — ABNORMAL HIGH (ref <150)

## 2016-12-10 ENCOUNTER — Ambulatory Visit (INDEPENDENT_AMBULATORY_CARE_PROVIDER_SITE_OTHER): Payer: BLUE CROSS/BLUE SHIELD | Admitting: Internal Medicine

## 2016-12-10 ENCOUNTER — Encounter: Payer: Self-pay | Admitting: Internal Medicine

## 2016-12-10 VITALS — BP 124/80 | HR 88 | Temp 98.0°F | Wt 242.0 lb

## 2016-12-10 DIAGNOSIS — E781 Pure hyperglyceridemia: Secondary | ICD-10-CM | POA: Diagnosis not present

## 2016-12-10 DIAGNOSIS — F5101 Primary insomnia: Secondary | ICD-10-CM

## 2016-12-10 DIAGNOSIS — I10 Essential (primary) hypertension: Secondary | ICD-10-CM | POA: Diagnosis not present

## 2016-12-10 DIAGNOSIS — Z87891 Personal history of nicotine dependence: Secondary | ICD-10-CM

## 2016-12-10 MED ORDER — POTASSIUM CHLORIDE CRYS ER 20 MEQ PO TBCR: 20.0000 meq | EXTENDED_RELEASE_TABLET | Freq: Every day | ORAL | 1 refills | Status: DC

## 2016-12-10 MED ORDER — LOSARTAN POTASSIUM-HCTZ 100-25 MG PO TABS: 1.0000 | ORAL_TABLET | Freq: Every day | ORAL | 1 refills | Status: DC

## 2016-12-10 MED ORDER — ALPRAZOLAM 0.5 MG PO TABS: 0.5000 mg | ORAL_TABLET | Freq: Every evening | ORAL | 5 refills | Status: DC | PRN

## 2016-12-10 NOTE — Progress Notes (Signed)
   Subjective:    Patient ID: Jesus Roy, male    DOB: 1975/12/10, 41 y.o.   MRN: [id number]  HPI In today for 92-month follow-up.  History of hypertension, allergic rhinitis, attention deficit disorder.  He does take potassium supplement as he is on losartan HCTZ 100/25 daily.  Does take alprazolam to sleep.  No new complaints.  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  Married with 1 stepson.  Continues to smoke.  Talked about smoking cessation once again.  Recent lipid panel showed triglycerides 837 and total cholesterol 205 with HDL cholesterol of 27.  His serum was extremely lipemic when it was drawn.  He does not follow a low-fat diet.  It is odd that he had such extreme hypertriglyceridemia.  He says he was fasting.  He is going to return on November 19 for recheck of his lipids along with hemoglobin A1c.    Review of Systems  See above    Objective:   Physical Exam  Constitutional: He appears well-developed and well-nourished.  HENT:  Head: Normocephalic.  Cardiovascular: Normal rate, regular rhythm and normal heart sounds.  No murmur heard. Pulmonary/Chest: No respiratory distress. He has no wheezes.  Musculoskeletal: He exhibits no edema.  Skin: Skin is warm and dry.  Vitals reviewed.         Assessment & Plan:  Essential hypertension  History of smoking  Insomnia treated with Xanax  Mixed hyperlipidemia-I am not sure he was fasting when he had his lipid panel drawn with market hypertriglyceridemia.  He says he was going to repeat this along with hemoglobin A1c in a few days.  Addendum: On November 19 lipid panel and hemoglobin A1c drawn.  Hemoglobin A1c is within normal limits.  Triglycerides have improved from 837-224.  I am going to put him on fenofibrate and follow-up in 3 months.  Plan: Once again discussion regarding smoking cessation.  He is not interested in quitting.  Continue losartan HCTZ for hypertension and follow-up at  time of physical exam in 6 months.

## 2016-12-19 ENCOUNTER — Encounter: Payer: Self-pay | Admitting: Internal Medicine

## 2016-12-19 NOTE — Patient Instructions (Addendum)
.    Take fenofibrate for hypertriglyceridemia.  Watch diet.  Return in 3 months.

## 2016-12-20 ENCOUNTER — Telehealth: Payer: Self-pay

## 2016-12-20 ENCOUNTER — Other Ambulatory Visit: Payer: BLUE CROSS/BLUE SHIELD | Admitting: Internal Medicine

## 2016-12-20 DIAGNOSIS — E669 Obesity, unspecified: Secondary | ICD-10-CM

## 2016-12-20 DIAGNOSIS — E781 Pure hyperglyceridemia: Secondary | ICD-10-CM

## 2016-12-20 MED ORDER — FENOFIBRATE 160 MG PO TABS: 160.0000 mg | ORAL_TABLET | Freq: Every day | ORAL | 0 refills | Status: DC

## 2016-12-20 NOTE — Telephone Encounter (Signed)
-----   Message from Elby Showers, MD sent at 12/20/2016  4:36 PM EST ----- Your triglycerides are much better but I want you to take fenofibrate 160 mg daily and follow up in 3 months. This will lower triglycerides.

## 2016-12-20 NOTE — Telephone Encounter (Signed)
Medication sent to pharmacy and pt stated he needs to call back and make appointment because of his hectic work schedule

## 2016-12-21 LAB — HEMOGLOBIN A1C
EAG (MMOL/L): 5.8 (calc)
Hgb A1c MFr Bld: 5.3 % of total Hgb (ref <5.7)
MEAN PLASMA GLUCOSE: 105 (calc)

## 2016-12-21 LAB — LIPID PANEL
CHOL/HDL RATIO: 4.8 (calc) (ref <5.0)
Cholesterol: 160 mg/dL (ref <200)
HDL: 33 mg/dL — ABNORMAL LOW (ref >40)
LDL Cholesterol (Calc): 96 mg/dL (calc)
NON-HDL CHOLESTEROL (CALC): 127 mg/dL (ref <130)
Triglycerides: 224 mg/dL — ABNORMAL HIGH (ref <150)

## 2017-02-03 ENCOUNTER — Telehealth: Payer: Self-pay | Admitting: Internal Medicine

## 2017-02-03 MED ORDER — ALPRAZOLAM 0.5 MG PO TABS: 0.5000 mg | ORAL_TABLET | Freq: Every evening | ORAL | 0 refills | Status: DC | PRN

## 2017-02-03 NOTE — Telephone Encounter (Signed)
I thought he had refills please check

## 2017-02-03 NOTE — Telephone Encounter (Signed)
Refill once and see how long it lasts

## 2017-02-03 NOTE — Telephone Encounter (Signed)
He had refills on it when he came in for his 6 month refill and Loma Sousa removed them because she said "there shouldn't be refills on this medication".  So, that's why he's calling for refill now.  He said he had like 3 left and she took them off.

## 2017-02-03 NOTE — Telephone Encounter (Signed)
Patient calling to request refill on Xanax.  He was just here for follow up in November.    Pharmacy:  [facility]  Best # for contact:  [phone]

## 2017-02-03 NOTE — Telephone Encounter (Signed)
Phoned in  ALPRAZolam (XANAX) 0.5 MG tablet 30 tablet no refills.   Route: Take 1 tablet (0.5 mg total) at bedtime as needed by mouth for anxiety. - Oral   To [facility] # [place], [place]

## 2017-06-13 ENCOUNTER — Ambulatory Visit (INDEPENDENT_AMBULATORY_CARE_PROVIDER_SITE_OTHER): Payer: BLUE CROSS/BLUE SHIELD | Admitting: Internal Medicine

## 2017-06-13 ENCOUNTER — Other Ambulatory Visit: Payer: Self-pay | Admitting: Internal Medicine

## 2017-06-13 ENCOUNTER — Encounter: Payer: Self-pay | Admitting: Internal Medicine

## 2017-06-13 VITALS — BP 120/90 | HR 95 | Wt 244.0 lb

## 2017-06-13 DIAGNOSIS — Z87891 Personal history of nicotine dependence: Secondary | ICD-10-CM

## 2017-06-13 DIAGNOSIS — I10 Essential (primary) hypertension: Secondary | ICD-10-CM | POA: Diagnosis not present

## 2017-06-13 DIAGNOSIS — Z6835 Body mass index (BMI) 35.0-35.9, adult: Secondary | ICD-10-CM

## 2017-06-13 DIAGNOSIS — E781 Pure hyperglyceridemia: Secondary | ICD-10-CM | POA: Diagnosis not present

## 2017-06-13 DIAGNOSIS — G4709 Other insomnia: Secondary | ICD-10-CM

## 2017-06-13 MED ORDER — POTASSIUM CHLORIDE CRYS ER 20 MEQ PO TBCR: 20.0000 meq | EXTENDED_RELEASE_TABLET | Freq: Every day | ORAL | 1 refills | Status: DC

## 2017-06-13 MED ORDER — LOSARTAN POTASSIUM-HCTZ 100-25 MG PO TABS: 1.0000 | ORAL_TABLET | Freq: Every day | ORAL | 1 refills | Status: DC

## 2017-06-13 NOTE — Progress Notes (Signed)
   Subjective:    Patient ID: Jesus Roy, male    DOB: Feb 08, 1975, 42 y.o.   MRN: [id number]  HPI 42 year old Male in today for follow-up of medical issues.  He has a history of hypertension, hyperlipidemia, history of smoking 1/2 pack cigarettes daily and obesity.  Takes Xanax sparingly for  insomnia and this is been refilled.  Unfortunately he quit taking fenofibrate because he said he did not like the way it made him feel.  He is not fasting today and will return next week for fasting lipid panel.  History of low HDL cholesterol.  Triglycerides were 224 in November.  Apparently his lipid panel December 07, 2016 revealed triglycerides of 837 and this is thought to be an error.  Repeat lipid panel December 20, 2016 showed triglycerides to be 224.  Fenofibrate was started in November.    Review of Systems  See above     Objective:   Physical Exam Neck supple.  Chest clear.  Cardiac exam regular rate and rhythm normal S1 and S2.  Extremities without edema   Discussion regarding reduction of cardiac risk factors including smoking cessation, weight management and  lipid management.         Assessment & Plan:  History of hypertriglyceridemia-he is off fenofibrate.  Follow-up lipid panel next week when he is fasting.  Essential hypertension continue same medication  History of smoking-advised to quit  History of  insomnia treated with Xanax sparingly  Obesity-BMI-35.52-needs to watch diet and get more exercise  Plan: Review lipid panel next week.  May benefit from Cardiology consultation and possible stress testing.

## 2017-06-13 NOTE — Patient Instructions (Signed)
Please return next week for fasting lipid panel off fenofibrate.  I am concerned about cardiac risk factors  hypertriglyceridemia, obesity, smoking.  Discussion regarding need for smoking cessation and dietary management of obesity and hypertriglyceridemia.

## 2017-06-13 NOTE — Telephone Encounter (Signed)
Refill once 

## 2017-06-23 ENCOUNTER — Other Ambulatory Visit: Payer: BLUE CROSS/BLUE SHIELD | Admitting: Internal Medicine

## 2017-06-23 DIAGNOSIS — E781 Pure hyperglyceridemia: Secondary | ICD-10-CM

## 2017-06-23 LAB — LIPID PANEL
CHOL/HDL RATIO: 5 (calc) — AB (ref <5.0)
CHOLESTEROL: 186 mg/dL (ref <200)
HDL: 37 mg/dL — AB (ref >40)
LDL Cholesterol (Calc): 123 mg/dL (calc) — ABNORMAL HIGH
Non-HDL Cholesterol (Calc): 149 mg/dL (calc) — ABNORMAL HIGH (ref <130)
Triglycerides: 144 mg/dL (ref <150)

## 2017-06-23 NOTE — Addendum Note (Signed)
Addended by: Mady Haagensen on: 06/23/2017 08:54 AM   Modules accepted: Orders

## 2017-07-25 ENCOUNTER — Other Ambulatory Visit: Payer: Self-pay | Admitting: Internal Medicine

## 2017-07-25 NOTE — Telephone Encounter (Signed)
Refill x 90 days 

## 2017-07-27 ENCOUNTER — Other Ambulatory Visit: Payer: Self-pay | Admitting: Internal Medicine

## 2017-07-27 NOTE — Telephone Encounter (Signed)
This was refilled today. Not sure why we are getting second request

## 2017-12-19 ENCOUNTER — Other Ambulatory Visit: Payer: Self-pay | Admitting: Internal Medicine

## 2017-12-19 MED ORDER — POTASSIUM CHLORIDE CRYS ER 20 MEQ PO TBCR: 20.0000 meq | EXTENDED_RELEASE_TABLET | Freq: Every day | ORAL | 0 refills | Status: DC

## 2017-12-19 MED ORDER — LOSARTAN POTASSIUM-HCTZ 100-25 MG PO TABS: 1.0000 | ORAL_TABLET | Freq: Every day | ORAL | 0 refills | Status: DC

## 2017-12-19 NOTE — Addendum Note (Signed)
Addended by: Mady Haagensen on: 12/19/2017 04:02 PM   Modules accepted: Orders

## 2017-12-19 NOTE — Telephone Encounter (Signed)
Patient called regarding this refill.Marland Kitchen He states that he only has one pill left. He is scheduled to come in for a follow up on January 7th, 2020 at 3:45pm. (patient said he was not able to come in until January)

## 2018-02-07 ENCOUNTER — Encounter: Payer: Self-pay | Admitting: Internal Medicine

## 2018-02-07 ENCOUNTER — Ambulatory Visit (INDEPENDENT_AMBULATORY_CARE_PROVIDER_SITE_OTHER): Payer: BLUE CROSS/BLUE SHIELD | Admitting: Internal Medicine

## 2018-02-07 VITALS — BP 130/90 | HR 88 | Ht 69.5 in | Wt 248.0 lb

## 2018-02-07 DIAGNOSIS — Z6836 Body mass index (BMI) 36.0-36.9, adult: Secondary | ICD-10-CM | POA: Diagnosis not present

## 2018-02-07 DIAGNOSIS — Z87891 Personal history of nicotine dependence: Secondary | ICD-10-CM | POA: Diagnosis not present

## 2018-02-07 DIAGNOSIS — E7800 Pure hypercholesterolemia, unspecified: Secondary | ICD-10-CM | POA: Diagnosis not present

## 2018-02-07 DIAGNOSIS — I10 Essential (primary) hypertension: Secondary | ICD-10-CM

## 2018-02-07 MED ORDER — ALPRAZOLAM 0.5 MG PO TABS: ORAL_TABLET | ORAL | 5 refills | Status: DC

## 2018-02-07 MED ORDER — POTASSIUM CHLORIDE CRYS ER 20 MEQ PO TBCR: 20.0000 meq | EXTENDED_RELEASE_TABLET | Freq: Every day | ORAL | 1 refills | Status: DC

## 2018-02-07 MED ORDER — LOSARTAN POTASSIUM-HCTZ 100-25 MG PO TABS: 1.0000 | ORAL_TABLET | Freq: Every day | ORAL | 1 refills | Status: DC

## 2018-02-07 MED ORDER — AMLODIPINE BESYLATE 5 MG PO TABS: 5.0000 mg | ORAL_TABLET | Freq: Every day | ORAL | 3 refills | Status: DC

## 2018-02-26 NOTE — Patient Instructions (Signed)
Please add amlodipine to losartan HCTZ and follow-up at the end of January

## 2018-02-26 NOTE — Progress Notes (Signed)
   Subjective:    Patient ID: Jesus Roy, male    DOB: [birth year], 43 y.o.   MRN: [id number]  HPI 43 year old male in today for 40-month follow-up.  He has a history of hypertension hyperlipidemia and history of smoking 1/2 pack of cigarettes daily as well as obesity.  He takes Xanax sparingly for insomnia.  When he was here in May his blood pressure was 120/90.  He had been placed on fenofibrate but quit taking it because he did not like the way it made him feel.  In April 2018 his triglycerides were 166, total cholesterol 205 and LDL cholesterol 129.  In November 2018 triglycerides were 837, HDL cholesterol 27, total cholesterol 205 and LDL could not be calculated.  It may be that he had had a meal and was not fasting.  He returned later in November 2018 and triglycerides were 224, total cholesterol 160 and LDL cholesterol 96.  Prior to that the only lipid panel we have on file is in February 2017 when triglycerides were 161, cholesterol 200 and LDL cholesterol 129.  Has been on losartan HCTZ 100/25 since 2016.  Was started on losartan 50 mg daily in 2015  It does seem that he normalized his triglycerides in May by changing eating habits.  He is off fenofibrate.  However his blood pressure remains elevated diastolically.  Review of Systems see above     Objective:   Physical Exam Blood pressure 130/80, pulse 88, weight 248 pounds and BMI is 36.10 Neck is supple without JVD thyromegaly or carotid bruits.  Chest clear to auscultation.  Cardiac exam regular rate and rhythm normal S1 and S2.  Extremities without edema.      Assessment & Plan:  BMI 36.10-needs to lose weight.  Encourage diet and exercise.  He does not want to go to obesity clinic.  Elevated blood pressure at 130/90-add Norvasc 5 mg daily and follow-up at the end of January continue losartan HCTZ 100/50 daily  History of hypertriglyceridemia and used to take fenofibrate but recent lipid panel in the Spring was  within normal limits with exception of an elevated LDL of 123 and needs to be watched.  He does not want to be on lipid-lowering medication.  He was checked for diabetes in 2018 and hemoglobin A1c was 5.3%  Plan: He will add amlodipine to his blood pressure medications and he will return at the end of January for follow-up.

## 2018-02-28 ENCOUNTER — Ambulatory Visit: Payer: BLUE CROSS/BLUE SHIELD | Admitting: Internal Medicine

## 2018-03-10 ENCOUNTER — Ambulatory Visit (INDEPENDENT_AMBULATORY_CARE_PROVIDER_SITE_OTHER): Payer: BLUE CROSS/BLUE SHIELD | Admitting: Internal Medicine

## 2018-03-10 ENCOUNTER — Encounter: Payer: Self-pay | Admitting: Internal Medicine

## 2018-03-10 VITALS — BP 110/70 | HR 84 | Temp 98.6°F | Ht 69.5 in | Wt 248.0 lb

## 2018-03-10 DIAGNOSIS — I10 Essential (primary) hypertension: Secondary | ICD-10-CM

## 2018-03-10 NOTE — Progress Notes (Signed)
   Subjective:    Patient ID: Criag Wicklund, male    DOB: 1976/01/16, 43 y.o.   MRN: [id number]  HPI At last visit in early January blood pressure was elevated at 130/90.  He is overweight at 248 pounds.  BMI is 36.10.  At last visit we added amlodipine to losartan HCTZ 100/25 daily.  He also has hyperlipidemia and is on fenofibrate.    Review of Systems no new complaints     Objective:   Physical Exam His blood pressure is excellent today at 110/70 after adding Norvasc.       Assessment & Plan:  Essential hypertension-improved with adding amlodipine to losartan HCTZ.  Continue this regimen of 3 drugs.  History of smoking-advised quitting  Obesity- advise diet exercise and weight loss  Hypertriglyceridemia-continue fenofibrate  Plan: Physical exam scheduled for July

## 2018-04-01 NOTE — Patient Instructions (Signed)
Continue amlodipine in addition to losartan HCTZ.  Watch diet.  Try to get some exercise and lose weight.  Please quit smoking.  Follow-up in July for physical examination and fasting lab work

## 2018-04-17 ENCOUNTER — Ambulatory Visit (INDEPENDENT_AMBULATORY_CARE_PROVIDER_SITE_OTHER): Payer: BLUE CROSS/BLUE SHIELD | Admitting: Internal Medicine

## 2018-04-17 ENCOUNTER — Other Ambulatory Visit: Payer: Self-pay

## 2018-04-17 ENCOUNTER — Encounter: Payer: Self-pay | Admitting: Internal Medicine

## 2018-04-17 VITALS — BP 130/88 | HR 77 | Temp 98.5°F | Ht 69.5 in | Wt 240.0 lb

## 2018-04-17 DIAGNOSIS — Z87891 Personal history of nicotine dependence: Secondary | ICD-10-CM

## 2018-04-17 DIAGNOSIS — I10 Essential (primary) hypertension: Secondary | ICD-10-CM | POA: Diagnosis not present

## 2018-04-17 DIAGNOSIS — J22 Unspecified acute lower respiratory infection: Secondary | ICD-10-CM | POA: Diagnosis not present

## 2018-04-17 MED ORDER — HYDROCODONE-HOMATROPINE 5-1.5 MG/5ML PO SYRP: 5.0000 mL | ORAL_SOLUTION | Freq: Three times a day (TID) | ORAL | 0 refills | Status: DC | PRN

## 2018-04-17 MED ORDER — DOXYCYCLINE HYCLATE 100 MG PO TABS: 100.0000 mg | ORAL_TABLET | Freq: Two times a day (BID) | ORAL | 0 refills | Status: DC

## 2018-04-17 NOTE — Progress Notes (Signed)
   Subjective:    Patient ID: Jesus Roy, male    DOB: March 20, 1975, 43 y.o.   MRN: [id number]  HPI 43 year old Male with HTN and hx of smoking with onset early last week of sore throat.  No fever or chills.  No myalgias.  Then developed lower respiratory infection symptoms with cough and congestion.  Sore throat disappeared.  No travel history.  Wife and son have similar illnesses.  Has been taking Zyrtec, decongestant and Mucinex.  Has not smoked in a week    Review of Systems see above     Objective:   Physical Exam Temperature 98.5 degrees orally pulse 98 degrees.  Blood pressure 130/88, pulse 77.  Weight 240 pounds. Skin warm and dry.  Pharynx not injected.  TMs are clear.  Neck is supple without adenopathy.  Chest is clear to auscultation without rales or wheezing.      Assessment & Plan:  Acute lower respiratory infection  History of smoking  Plan: Doxycycline 100 mg twice daily for 10 days.  Hycodan 1 teaspoon p.o. every 8 hours as needed cough.  Out of work until Thursday, March 19.  Note provided.

## 2018-04-17 NOTE — Patient Instructions (Signed)
Doxycycline 100 mg twice daily for 10 days.  Hycodan 1 teaspoon p.o. every 8 hours as needed cough.  Rest and drink plenty of fluids.  Out of work until March 19. Note provided.

## 2018-06-27 ENCOUNTER — Other Ambulatory Visit: Payer: Self-pay | Admitting: Internal Medicine

## 2018-08-21 ENCOUNTER — Other Ambulatory Visit: Payer: BC Managed Care – PPO | Admitting: Internal Medicine

## 2018-08-21 ENCOUNTER — Other Ambulatory Visit: Payer: Self-pay

## 2018-08-21 DIAGNOSIS — G4709 Other insomnia: Secondary | ICD-10-CM

## 2018-08-21 DIAGNOSIS — I10 Essential (primary) hypertension: Secondary | ICD-10-CM | POA: Diagnosis not present

## 2018-08-21 DIAGNOSIS — E7800 Pure hypercholesterolemia, unspecified: Secondary | ICD-10-CM

## 2018-08-21 DIAGNOSIS — Z0000 Encounter for general adult medical examination without abnormal findings: Secondary | ICD-10-CM

## 2018-08-21 DIAGNOSIS — E781 Pure hyperglyceridemia: Secondary | ICD-10-CM

## 2018-08-22 LAB — CBC WITH DIFFERENTIAL/PLATELET
Absolute Monocytes: 550 cells/uL (ref 200–950)
Basophils Absolute: 58 cells/uL (ref 0–200)
Basophils Relative: 0.9 %
Eosinophils Absolute: 128 cells/uL (ref 15–500)
Eosinophils Relative: 2 %
HCT: 43.8 % (ref 38.5–50.0)
Hemoglobin: 15 g/dL (ref 13.2–17.1)
Lymphs Abs: 2368 cells/uL (ref 850–3900)
MCH: 28.1 pg (ref 27.0–33.0)
MCHC: 34.2 g/dL (ref 32.0–36.0)
MCV: 82.2 fL (ref 80.0–100.0)
MPV: 10.5 fL (ref 7.5–12.5)
Monocytes Relative: 8.6 %
Neutro Abs: 3296 cells/uL (ref 1500–7800)
Neutrophils Relative %: 51.5 %
Platelets: 289 10*3/uL (ref 140–400)
RBC: 5.33 10*6/uL (ref 4.20–5.80)
RDW: 13.4 % (ref 11.0–15.0)
Total Lymphocyte: 37 %
WBC: 6.4 10*3/uL (ref 3.8–10.8)

## 2018-08-22 LAB — LIPID PANEL
Cholesterol: 181 mg/dL (ref <200)
HDL: 40 mg/dL (ref >=40)
LDL Cholesterol (Calc): 116 mg/dL (calc) — ABNORMAL HIGH
Non-HDL Cholesterol (Calc): 141 mg/dL (calc) — ABNORMAL HIGH (ref <130)
Total CHOL/HDL Ratio: 4.5 (calc) (ref <5.0)
Triglycerides: 135 mg/dL (ref <150)

## 2018-08-22 LAB — COMPLETE METABOLIC PANEL WITH GFR
AG Ratio: 1.8 (calc) (ref 1.0–2.5)
ALT: 53 U/L — ABNORMAL HIGH (ref 9–46)
AST: 26 U/L (ref 10–40)
Albumin: 4.3 g/dL (ref 3.6–5.1)
Alkaline phosphatase (APISO): 108 U/L (ref 36–130)
BUN: 10 mg/dL (ref 7–25)
CO2: 26 mmol/L (ref 20–32)
Calcium: 9.6 mg/dL (ref 8.6–10.3)
Chloride: 101 mmol/L (ref 98–110)
Creat: 0.7 mg/dL (ref 0.60–1.35)
GFR, Est African American: 134 mL/min/{1.73_m2} (ref >=60)
GFR, Est Non African American: 116 mL/min/{1.73_m2} (ref >=60)
Globulin: 2.4 g/dL (calc) (ref 1.9–3.7)
Glucose, Bld: 94 mg/dL (ref 65–99)
Potassium: 4 mmol/L (ref 3.5–5.3)
Sodium: 141 mmol/L (ref 135–146)
Total Bilirubin: 0.5 mg/dL (ref 0.2–1.2)
Total Protein: 6.7 g/dL (ref 6.1–8.1)

## 2018-08-25 ENCOUNTER — Other Ambulatory Visit: Payer: Self-pay

## 2018-08-25 ENCOUNTER — Ambulatory Visit (INDEPENDENT_AMBULATORY_CARE_PROVIDER_SITE_OTHER): Payer: BC Managed Care – PPO | Admitting: Internal Medicine

## 2018-08-25 ENCOUNTER — Encounter: Payer: Self-pay | Admitting: Internal Medicine

## 2018-08-25 VITALS — BP 110/80 | HR 84 | Temp 98.7°F | Ht 70.0 in | Wt 242.0 lb

## 2018-08-25 DIAGNOSIS — Z87891 Personal history of nicotine dependence: Secondary | ICD-10-CM

## 2018-08-25 DIAGNOSIS — I10 Essential (primary) hypertension: Secondary | ICD-10-CM

## 2018-08-25 DIAGNOSIS — E781 Pure hyperglyceridemia: Secondary | ICD-10-CM | POA: Diagnosis not present

## 2018-08-25 DIAGNOSIS — Z8639 Personal history of other endocrine, nutritional and metabolic disease: Secondary | ICD-10-CM

## 2018-08-25 DIAGNOSIS — Z6834 Body mass index (BMI) 34.0-34.9, adult: Secondary | ICD-10-CM

## 2018-08-25 DIAGNOSIS — E7800 Pure hypercholesterolemia, unspecified: Secondary | ICD-10-CM

## 2018-08-25 DIAGNOSIS — Z0000 Encounter for general adult medical examination without abnormal findings: Secondary | ICD-10-CM

## 2018-08-25 DIAGNOSIS — R740 Nonspecific elevation of levels of transaminase and lactic acid dehydrogenase [LDH]: Secondary | ICD-10-CM

## 2018-08-25 DIAGNOSIS — K219 Gastro-esophageal reflux disease without esophagitis: Secondary | ICD-10-CM | POA: Diagnosis not present

## 2018-08-25 DIAGNOSIS — G4709 Other insomnia: Secondary | ICD-10-CM

## 2018-08-25 DIAGNOSIS — R7401 Elevation of levels of liver transaminase levels: Secondary | ICD-10-CM

## 2018-08-25 LAB — POCT URINALYSIS DIPSTICK
Appearance: NEGATIVE
Bilirubin, UA: NEGATIVE
Blood, UA: NEGATIVE
Glucose, UA: NEGATIVE
Ketones, UA: NEGATIVE
Leukocytes, UA: NEGATIVE
Nitrite, UA: NEGATIVE
Odor: NEGATIVE
Protein, UA: NEGATIVE
Spec Grav, UA: 1.01 (ref 1.010–1.025)
Urobilinogen, UA: 0.2 E.U./dL
pH, UA: 6 (ref 5.0–8.0)

## 2018-08-25 MED ORDER — FENOFIBRATE 160 MG PO TABS: 160.0000 mg | ORAL_TABLET | Freq: Every day | ORAL | 3 refills | Status: DC

## 2018-08-25 MED ORDER — LOSARTAN POTASSIUM 100 MG PO TABS: 100.0000 mg | ORAL_TABLET | Freq: Every day | ORAL | 3 refills | Status: DC

## 2018-08-25 MED ORDER — POTASSIUM CHLORIDE CRYS ER 20 MEQ PO TBCR: 20.0000 meq | EXTENDED_RELEASE_TABLET | Freq: Every day | ORAL | 3 refills | Status: DC

## 2018-08-25 MED ORDER — ALPRAZOLAM 0.5 MG PO TABS: ORAL_TABLET | ORAL | 2 refills | Status: DC

## 2018-08-25 MED ORDER — HYDROCHLOROTHIAZIDE 25 MG PO TABS: 25.0000 mg | ORAL_TABLET | Freq: Every day | ORAL | 3 refills | Status: DC

## 2018-08-25 MED ORDER — ESOMEPRAZOLE MAGNESIUM 40 MG PO CPDR: 40.0000 mg | DELAYED_RELEASE_CAPSULE | Freq: Every day | ORAL | 3 refills | Status: DC

## 2018-08-27 NOTE — Patient Instructions (Signed)
It was a pleasure to see you today.  Please work on diet exercise and weight loss.  This is very important for blood pressure control, lipid management and fatty liver improvement.  Follow-up in 6 months.

## 2018-08-27 NOTE — Progress Notes (Signed)
Subjective:    Patient ID: Jesus Roy, male    DOB: 27-May-1975, 43 y.o.   MRN: [id number]  HPI 43 year old male in today for health maintenance exam and evaluation of medical issues.  He has a history of hypertension and hypertriglyceridemia.  History of GE reflux.  Is overweight.  Past medical history: Fractured left radial hand 1992, ganglion cyst removed from left wrist 2005, left foot surgery by podiatrist February 1999.  He is allergic to penicillin-causes a rash.  Family history: Father with history of hypertension and hyperlipidemia.  One brother in good health.  Mother in good health.  Social history: He is married.  He quit smoking not long ago.  Denies alcohol consumption.  One stepson age 63.  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  History of insomnia for which he takes alprazolam.  History of attention deficit issues but does not take attention deficit medication at this time.    Review of Systems  Constitutional: Negative.   Respiratory: Negative.   Cardiovascular: Negative.   Gastrointestinal: Negative.   Genitourinary: Negative.        Objective:   Physical Exam Vitals signs reviewed.  Constitutional:      Appearance: Normal appearance.  HENT:     Head: Normocephalic and atraumatic.     Right Ear: Tympanic membrane normal.     Left Ear: Tympanic membrane normal.     Nose: Nose normal.     Mouth/Throat:     Mouth: Mucous membranes are moist.     Pharynx: Oropharynx is clear.  Eyes:     General: No scleral icterus.       Right eye: No discharge.        Left eye: No discharge.     Extraocular Movements: Extraocular movements intact.     Conjunctiva/sclera: Conjunctivae normal.     Pupils: Pupils are equal, round, and reactive to light.  Neck:     Musculoskeletal: Neck supple. No neck rigidity.     Vascular: No carotid bruit.  Cardiovascular:     Rate and Rhythm: Normal rate and regular rhythm.     Heart sounds: Normal heart  sounds. No murmur.  Pulmonary:     Effort: Pulmonary effort is normal. No respiratory distress.     Breath sounds: Normal breath sounds. No wheezing or rales.  Abdominal:     General: Bowel sounds are normal. There is no distension.     Palpations: Abdomen is soft.     Tenderness: There is no abdominal tenderness. There is no right CVA tenderness, left CVA tenderness or guarding.  Musculoskeletal:     Right lower leg: No edema.     Left lower leg: No edema.  Skin:    General: Skin is warm and dry.     Findings: No rash.  Neurological:     General: No focal deficit present.     Mental Status: He is alert and oriented to person, place, and time.  Psychiatric:        Mood and Affect: Mood normal.        Behavior: Behavior normal.        Thought Content: Thought content normal.        Judgment: Judgment normal.           Assessment & Plan:  Essential hypertension stable on current regimen at 110/80  BMI 34.72 and weight 242 pounds.  Needs to really work on diet exercise and weight loss.  History  of smoking-I am very pleased that he quit smoking.  Hypokalemia secondary to diuretic therapy.  Potassium normal on potassium supplement while taking diuretic  History of hypertriglyceridemia- triglycerides are normal.  LDL slightly elevated at 116.  Total cholesterol 181 and HDL cholesterol is 40.  Continue to work on diet exercise and weight loss.  He is treated with fenofibrate but has a mildly elevated LDL.  We might consider Crestor instead at next visit.  Elevated SGPT-likely related to fatty liver from obesity.  NT is 26 and PT is 53.  He has had similar readings for several years.  He does not consume alcohol he says.  Insomnia-takes Xanax.  Has trouble going to sleep and getting 6:51 AM.  This works well for him and he uses it correctly.  Plan: He should return in 6 months for follow-up.  For now continue same medications.

## 2018-08-31 LAB — NICOTINE/COTININE SP

## 2018-08-31 LAB — EXTRA URINE SPECIMEN

## 2018-08-31 NOTE — Addendum Note (Signed)
Addended by: Mady Haagensen on: 08/31/2018 10:54 AM   Modules accepted: Orders

## 2018-09-04 ENCOUNTER — Other Ambulatory Visit: Payer: Self-pay

## 2018-09-04 ENCOUNTER — Ambulatory Visit (INDEPENDENT_AMBULATORY_CARE_PROVIDER_SITE_OTHER): Payer: BC Managed Care – PPO | Admitting: Internal Medicine

## 2018-09-04 DIAGNOSIS — Z87891 Personal history of nicotine dependence: Secondary | ICD-10-CM

## 2018-09-08 LAB — NICOTINE AND COTININE LC/MS/MS, U
COTININE, URINE: <2 ng/mL
NICOTINE, URINE: <2 ng/mL

## 2018-09-08 LAB — EXTRA URINE SPECIMEN

## 2018-09-16 ENCOUNTER — Encounter: Payer: Self-pay | Admitting: Internal Medicine

## 2018-09-16 NOTE — Progress Notes (Signed)
Needed to re-collect urine for nicotine screen. Correct container and code used.

## 2018-09-16 NOTE — Patient Instructions (Signed)
Urine recollected in correct container to screen for nicotine- pt request for insurance purposes. No charge

## 2019-02-06 ENCOUNTER — Other Ambulatory Visit: Payer: Self-pay | Admitting: Internal Medicine

## 2019-02-06 DIAGNOSIS — G4709 Other insomnia: Secondary | ICD-10-CM

## 2019-02-07 NOTE — Telephone Encounter (Signed)
He has an appointment here on 03/02/19

## 2019-02-07 NOTE — Telephone Encounter (Signed)
He is due for 6 month follow up with lipid panel

## 2019-02-23 ENCOUNTER — Other Ambulatory Visit: Payer: BC Managed Care – PPO | Admitting: Internal Medicine

## 2019-02-23 ENCOUNTER — Other Ambulatory Visit: Payer: Self-pay

## 2019-02-23 DIAGNOSIS — Z5181 Encounter for therapeutic drug level monitoring: Secondary | ICD-10-CM | POA: Diagnosis not present

## 2019-02-23 DIAGNOSIS — E781 Pure hyperglyceridemia: Secondary | ICD-10-CM | POA: Diagnosis not present

## 2019-02-23 DIAGNOSIS — Z79899 Other long term (current) drug therapy: Secondary | ICD-10-CM | POA: Diagnosis not present

## 2019-02-23 LAB — BASIC METABOLIC PANEL
BUN: 14 mg/dL (ref 7–25)
CO2: 27 mmol/L (ref 20–32)
Calcium: 9.6 mg/dL (ref 8.6–10.3)
Chloride: 105 mmol/L (ref 98–110)
Creat: 0.91 mg/dL (ref 0.60–1.35)
Glucose, Bld: 89 mg/dL (ref 65–99)
Potassium: 4.1 mmol/L (ref 3.5–5.3)
Sodium: 141 mmol/L (ref 135–146)

## 2019-02-23 LAB — LIPID PANEL
Cholesterol: 178 mg/dL (ref <200)
HDL: 46 mg/dL (ref >=40)
LDL Cholesterol (Calc): 115 mg/dL (calc) — ABNORMAL HIGH
Non-HDL Cholesterol (Calc): 132 mg/dL (calc) — ABNORMAL HIGH (ref <130)
Total CHOL/HDL Ratio: 3.9 (calc) (ref <5.0)
Triglycerides: 74 mg/dL (ref <150)

## 2019-03-02 ENCOUNTER — Ambulatory Visit (INDEPENDENT_AMBULATORY_CARE_PROVIDER_SITE_OTHER): Payer: BC Managed Care – PPO | Admitting: Internal Medicine

## 2019-03-02 ENCOUNTER — Encounter: Payer: Self-pay | Admitting: Internal Medicine

## 2019-03-02 ENCOUNTER — Other Ambulatory Visit: Payer: Self-pay

## 2019-03-02 VITALS — BP 120/84 | HR 87 | Temp 98.0°F | Ht 70.0 in | Wt 262.0 lb

## 2019-03-02 DIAGNOSIS — Z6837 Body mass index (BMI) 37.0-37.9, adult: Secondary | ICD-10-CM

## 2019-03-02 DIAGNOSIS — Z87891 Personal history of nicotine dependence: Secondary | ICD-10-CM

## 2019-03-02 DIAGNOSIS — E781 Pure hyperglyceridemia: Secondary | ICD-10-CM | POA: Diagnosis not present

## 2019-03-02 DIAGNOSIS — I10 Essential (primary) hypertension: Secondary | ICD-10-CM | POA: Diagnosis not present

## 2019-03-02 DIAGNOSIS — E7800 Pure hypercholesterolemia, unspecified: Secondary | ICD-10-CM | POA: Diagnosis not present

## 2019-03-02 DIAGNOSIS — G4709 Other insomnia: Secondary | ICD-10-CM | POA: Diagnosis not present

## 2019-03-02 DIAGNOSIS — Z23 Encounter for immunization: Secondary | ICD-10-CM

## 2019-03-02 DIAGNOSIS — Z8639 Personal history of other endocrine, nutritional and metabolic disease: Secondary | ICD-10-CM

## 2019-03-02 NOTE — Progress Notes (Signed)
   Subjective:    Patient ID: Jesus Roy, male    DOB: Sep 08, 1975, 44 y.o.   MRN: [id number]  HPI 44 year old Male seen for 8-month follow-up.  He quit smoking and gained 20 pounds.  His blood pressure is excellent on current regimen.  He feels well.  No known COVID-19 exposure.  Flu vaccine given today.  Tetanus immunization is up-to-date.  He has a history of essential hypertension and hyperlipidemia.  He is on amlodipine 5 mg daily, HCTZ 25 mg daily and losartan 100 mg daily.  He takes a potassium supplement.  He has GE reflux treated with Nexium and is on fenofibrate for hyper triglyceridemia.  Currently his LDL was elevated at 115 and was normal in 2018.  Weight gain may have increased his LDL.  His HDL was 46 and triglycerides are normal at 74.  Total cholesterol was 178.    Review of Systems no new complaints.  He has a history of anxiety treated with Xanax at bedtime as needed.     Objective:   Physical Exam Has gained 20 pounds since quitting smoking.  Skin warm and dry.  Nodes none.  Neck is supple without JVD thyromegaly or carotid bruits.  Chest is clear to auscultation.  Cardiac exam regular rate and rhythm normal S1 and S2.  Abdomen obese.  Biometric screening form completed today.  Trace lower extremity edema.  His blood pressure was 140/90 on arrival but was rechecked and was stable at 120/84.       Assessment & Plan:  20 pound weight gain after quitting smoking-needs to work on diet exercise and weight loss.  BMI is 37.59.  Insomnia treated with Xanax-this has been refilled  Hypertension stable on 3 drug regimen  History of hypokalemia on diuretic therapy.  Continue potassium supplement  GE reflux treated with Nexium.  Hyperlipidemia-treated with fenofibrate.  Needs to watch cholesterol intake his LDL is slightly elevated at 115.  Would benefit from more exercise and dietary discretion.  Plan: Follow-up in 6 months at which time he will be due for physical  examination.  Flu vaccine given.

## 2019-03-02 NOTE — Patient Instructions (Signed)
It was a pleasure to see you today.  Please work on diet exercise and weight loss.  Follow-up in 6 months for health maintenance exam and fasting lab work.  Congratulations on quitting smoking.  Unfortunately he has gained 40 pounds.  Continue current medications.  Flu vaccine given.

## 2019-03-12 ENCOUNTER — Other Ambulatory Visit: Payer: Self-pay | Admitting: Internal Medicine

## 2019-03-12 DIAGNOSIS — G4709 Other insomnia: Secondary | ICD-10-CM

## 2019-06-12 ENCOUNTER — Other Ambulatory Visit: Payer: Self-pay | Admitting: Internal Medicine

## 2019-07-30 ENCOUNTER — Other Ambulatory Visit: Payer: Self-pay | Admitting: Internal Medicine

## 2019-07-30 DIAGNOSIS — G4709 Other insomnia: Secondary | ICD-10-CM

## 2019-09-03 ENCOUNTER — Other Ambulatory Visit: Payer: BC Managed Care – PPO | Admitting: Internal Medicine

## 2019-09-03 ENCOUNTER — Other Ambulatory Visit: Payer: Self-pay

## 2019-09-03 DIAGNOSIS — I10 Essential (primary) hypertension: Secondary | ICD-10-CM

## 2019-09-03 DIAGNOSIS — Z87891 Personal history of nicotine dependence: Secondary | ICD-10-CM

## 2019-09-03 DIAGNOSIS — E781 Pure hyperglyceridemia: Secondary | ICD-10-CM | POA: Diagnosis not present

## 2019-09-03 DIAGNOSIS — K219 Gastro-esophageal reflux disease without esophagitis: Secondary | ICD-10-CM

## 2019-09-03 DIAGNOSIS — G4709 Other insomnia: Secondary | ICD-10-CM

## 2019-09-03 DIAGNOSIS — R7401 Elevation of levels of liver transaminase levels: Secondary | ICD-10-CM

## 2019-09-03 DIAGNOSIS — Z0000 Encounter for general adult medical examination without abnormal findings: Secondary | ICD-10-CM

## 2019-09-03 DIAGNOSIS — E7800 Pure hypercholesterolemia, unspecified: Secondary | ICD-10-CM | POA: Diagnosis not present

## 2019-09-03 DIAGNOSIS — Z6837 Body mass index (BMI) 37.0-37.9, adult: Secondary | ICD-10-CM

## 2019-09-04 LAB — CBC WITH DIFFERENTIAL/PLATELET
Absolute Monocytes: 589 cells/uL (ref 200–950)
Basophils Absolute: 50 cells/uL (ref 0–200)
Basophils Relative: 0.8 %
Eosinophils Absolute: 93 cells/uL (ref 15–500)
Eosinophils Relative: 1.5 %
HCT: 44.7 % (ref 38.5–50.0)
Hemoglobin: 14.9 g/dL (ref 13.2–17.1)
Lymphs Abs: 2096 cells/uL (ref 850–3900)
MCH: 28.1 pg (ref 27.0–33.0)
MCHC: 33.3 g/dL (ref 32.0–36.0)
MCV: 84.2 fL (ref 80.0–100.0)
MPV: 10.9 fL (ref 7.5–12.5)
Monocytes Relative: 9.5 %
Neutro Abs: 3373 cells/uL (ref 1500–7800)
Neutrophils Relative %: 54.4 %
Platelets: 285 10*3/uL (ref 140–400)
RBC: 5.31 10*6/uL (ref 4.20–5.80)
RDW: 13.5 % (ref 11.0–15.0)
Total Lymphocyte: 33.8 %
WBC: 6.2 10*3/uL (ref 3.8–10.8)

## 2019-09-04 LAB — LIPID PANEL
Cholesterol: 176 mg/dL (ref <200)
HDL: 43 mg/dL (ref >=40)
LDL Cholesterol (Calc): 110 mg/dL (calc) — ABNORMAL HIGH
Non-HDL Cholesterol (Calc): 133 mg/dL (calc) — ABNORMAL HIGH (ref <130)
Total CHOL/HDL Ratio: 4.1 (calc) (ref <5.0)
Triglycerides: 124 mg/dL (ref <150)

## 2019-09-04 LAB — COMPLETE METABOLIC PANEL WITH GFR
AG Ratio: 1.9 (calc) (ref 1.0–2.5)
ALT: 146 U/L — ABNORMAL HIGH (ref 9–46)
AST: 79 U/L — ABNORMAL HIGH (ref 10–40)
Albumin: 4.4 g/dL (ref 3.6–5.1)
Alkaline phosphatase (APISO): 93 U/L (ref 36–130)
BUN: 13 mg/dL (ref 7–25)
CO2: 28 mmol/L (ref 20–32)
Calcium: 9.8 mg/dL (ref 8.6–10.3)
Chloride: 103 mmol/L (ref 98–110)
Creat: 0.88 mg/dL (ref 0.60–1.35)
GFR, Est African American: 121 mL/min/{1.73_m2} (ref >=60)
GFR, Est Non African American: 104 mL/min/{1.73_m2} (ref >=60)
Globulin: 2.3 g/dL (calc) (ref 1.9–3.7)
Glucose, Bld: 106 mg/dL — ABNORMAL HIGH (ref 65–99)
Potassium: 4 mmol/L (ref 3.5–5.3)
Sodium: 140 mmol/L (ref 135–146)
Total Bilirubin: 0.6 mg/dL (ref 0.2–1.2)
Total Protein: 6.7 g/dL (ref 6.1–8.1)

## 2019-09-04 LAB — TSH: TSH: 4.5 mIU/L (ref 0.40–4.50)

## 2019-09-07 ENCOUNTER — Ambulatory Visit (INDEPENDENT_AMBULATORY_CARE_PROVIDER_SITE_OTHER): Payer: BC Managed Care – PPO | Admitting: Internal Medicine

## 2019-09-07 ENCOUNTER — Other Ambulatory Visit: Payer: Self-pay

## 2019-09-07 ENCOUNTER — Encounter: Payer: Self-pay | Admitting: Internal Medicine

## 2019-09-07 VITALS — BP 120/80 | HR 73 | Ht 70.0 in | Wt 258.0 lb

## 2019-09-07 DIAGNOSIS — I10 Essential (primary) hypertension: Secondary | ICD-10-CM | POA: Diagnosis not present

## 2019-09-07 DIAGNOSIS — Z5181 Encounter for therapeutic drug level monitoring: Secondary | ICD-10-CM

## 2019-09-07 DIAGNOSIS — R7989 Other specified abnormal findings of blood chemistry: Secondary | ICD-10-CM

## 2019-09-07 DIAGNOSIS — Z87891 Personal history of nicotine dependence: Secondary | ICD-10-CM

## 2019-09-07 DIAGNOSIS — Z6837 Body mass index (BMI) 37.0-37.9, adult: Secondary | ICD-10-CM

## 2019-09-07 DIAGNOSIS — G4709 Other insomnia: Secondary | ICD-10-CM

## 2019-09-07 DIAGNOSIS — Z8639 Personal history of other endocrine, nutritional and metabolic disease: Secondary | ICD-10-CM

## 2019-09-07 DIAGNOSIS — R748 Abnormal levels of other serum enzymes: Secondary | ICD-10-CM

## 2019-09-07 DIAGNOSIS — Z0000 Encounter for general adult medical examination without abnormal findings: Secondary | ICD-10-CM | POA: Diagnosis not present

## 2019-09-07 DIAGNOSIS — Z79899 Other long term (current) drug therapy: Secondary | ICD-10-CM

## 2019-09-07 DIAGNOSIS — E781 Pure hyperglyceridemia: Secondary | ICD-10-CM

## 2019-09-07 LAB — POCT URINALYSIS DIPSTICK
Appearance: NEGATIVE
Bilirubin, UA: NEGATIVE
Blood, UA: NEGATIVE
Glucose, UA: NEGATIVE
Ketones, UA: NEGATIVE
Leukocytes, UA: NEGATIVE
Nitrite, UA: NEGATIVE
Odor: NEGATIVE
Protein, UA: NEGATIVE
Spec Grav, UA: 1.015 (ref 1.010–1.025)
Urobilinogen, UA: 0.2 E.U./dL
pH, UA: 6.5 (ref 5.0–8.0)

## 2019-09-07 MED ORDER — HYDROCHLOROTHIAZIDE 25 MG PO TABS: 25.0000 mg | ORAL_TABLET | Freq: Every day | ORAL | 3 refills | Status: DC

## 2019-09-07 MED ORDER — FENOFIBRATE 160 MG PO TABS: 160.0000 mg | ORAL_TABLET | Freq: Every day | ORAL | 3 refills | Status: DC

## 2019-09-07 MED ORDER — AMLODIPINE BESYLATE 5 MG PO TABS: 5.0000 mg | ORAL_TABLET | Freq: Every day | ORAL | 3 refills | Status: DC

## 2019-09-07 MED ORDER — POTASSIUM CHLORIDE CRYS ER 20 MEQ PO TBCR: 20.0000 meq | EXTENDED_RELEASE_TABLET | Freq: Every day | ORAL | 3 refills | Status: DC

## 2019-09-07 MED ORDER — LOSARTAN POTASSIUM 100 MG PO TABS: 100.0000 mg | ORAL_TABLET | Freq: Every day | ORAL | 3 refills | Status: DC

## 2019-09-07 NOTE — Progress Notes (Signed)
° °  Subjective:    Patient ID: Jesus Roy, male    DOB: 10-19-1975, 44 y.o.   MRN: [id number]  HPI 44 year old male seen for health maintenance exam and evaluation of medical issues.  He has a history of hypertension and hypertriglyceridemia.  History of GE reflux.  He is overweight.  He is allergic to penicillin-causes a rash.  History of insomnia for which he takes alprazolam.  History of attention deficit issues but does not take medication for that at present time.  Past medical history: Fracture left radial head 1992, ganglion cyst removed from left wrist 2005, left foot surgery by podiatrist February 1999.  Social history: He is married.  He has quit smoking.  Denies alcohol consumption.  1 stepson age 51.  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  Family history: Father with history of hypertension and hyperlipidemia.  1 brother in good health.  Mother in good health.    Review of Systems no new complaints     Objective:   Physical Exam Blood pressure 120/80 pulse 73 pulse oximetry 98% weight 258 pounds BMI 37.2  Skin warm and dry.  No cervical adenopathy.  No thyromegaly.  No carotid bruits.  TMs are clear.  Chest is clear to auscultation.  Cardiac exam regular rate and rhythm normal S1 and S2 without murmurs or gallops.  Abdomen soft nondistended without hepatosplenomegaly masses or tenderness.  Extremities without pitting edema.  Neuro intact without focal deficits.  Affect thought judgment are normal.  Lab results showed elevated TSH of 4.50 which needs to be repeated in the near future.  He also has elevated liver functions of 79 and 146 respectively.  This could be due to alcohol obesity or other abnormalities of the liver.  He has cut back on alcohol consumption and follow-up in 4 weeks.  If not improved, we are going to need to do further liver studies and get an ultrasound of his liver.  He is to watch analgesic medications as well which can  affect liver functions.     Assessment & Plan:  Essential hypertension-stable on current regimen amlodipine and HCTZ as well as losartan.  BMI 37  Elevated liver enzymes-needs to be repeated after trial of alcohol and analgesic avoidance  Elevated TSH-we will repeat TSH upon return  Hyperlipidemia treated with fenofibrate 160 mg daily  Insomnia treated with Xanax at bedtime  Plan: Follow-up here in 4 weeks. GE reflux treated with Nexium

## 2019-09-28 ENCOUNTER — Other Ambulatory Visit: Payer: Self-pay

## 2019-09-28 ENCOUNTER — Other Ambulatory Visit: Payer: BC Managed Care – PPO | Admitting: Internal Medicine

## 2019-09-28 DIAGNOSIS — R7989 Other specified abnormal findings of blood chemistry: Secondary | ICD-10-CM

## 2019-09-28 LAB — HEPATIC FUNCTION PANEL
AG Ratio: 2.1 (calc) (ref 1.0–2.5)
ALT: 138 U/L — ABNORMAL HIGH (ref 9–46)
AST: 78 U/L — ABNORMAL HIGH (ref 10–40)
Albumin: 4.5 g/dL (ref 3.6–5.1)
Alkaline phosphatase (APISO): 88 U/L (ref 36–130)
Bilirubin, Direct: 0.1 mg/dL (ref 0.0–0.2)
Globulin: 2.1 g/dL (calc) (ref 1.9–3.7)
Indirect Bilirubin: 0.5 mg/dL (calc) (ref 0.2–1.2)
Total Bilirubin: 0.6 mg/dL (ref 0.2–1.2)
Total Protein: 6.6 g/dL (ref 6.1–8.1)

## 2019-09-30 NOTE — Patient Instructions (Signed)
Watch diet.  Try to lose some weight.  Watch alcohol consumption and analgesic medications.  Follow-up in 4 weeks.  Continue other medications as previously prescribed for hypertension and hyperlipidemia.  Thyroid function TSH is elevated and will be repeated upon return.

## 2019-10-01 ENCOUNTER — Telehealth: Payer: Self-pay | Admitting: Internal Medicine

## 2019-10-01 DIAGNOSIS — R748 Abnormal levels of other serum enzymes: Secondary | ICD-10-CM

## 2019-10-01 NOTE — Telephone Encounter (Signed)
Jesus Roy [phone]  [name] called to see if his lab results were back from Friday, I also sent him link to sign up for Mychart.

## 2019-10-01 NOTE — Telephone Encounter (Signed)
Pt was notified of results and instructions, pt verbalized understanding.  [place] has been ordered, patient was provided with GI phone number for him to call and schedule liver US.

## 2019-10-13 ENCOUNTER — Other Ambulatory Visit: Payer: Self-pay | Admitting: Internal Medicine

## 2019-10-13 DIAGNOSIS — K219 Gastro-esophageal reflux disease without esophagitis: Secondary | ICD-10-CM

## 2019-10-13 DIAGNOSIS — G4709 Other insomnia: Secondary | ICD-10-CM

## 2019-10-16 ENCOUNTER — Ambulatory Visit
Admission: RE | Admit: 2019-10-16 | Discharge: 2019-10-16 | Disposition: A | Discharge status: Home / Self Care | Payer: BC Managed Care – PPO | Source: Ambulatory Visit | Attending: Internal Medicine | Admitting: Internal Medicine

## 2019-10-16 DIAGNOSIS — R748 Abnormal levels of other serum enzymes: Secondary | ICD-10-CM

## 2019-10-18 ENCOUNTER — Other Ambulatory Visit: Payer: Self-pay

## 2019-10-18 ENCOUNTER — Encounter: Payer: Self-pay | Admitting: Internal Medicine

## 2019-10-18 ENCOUNTER — Ambulatory Visit (INDEPENDENT_AMBULATORY_CARE_PROVIDER_SITE_OTHER): Payer: BC Managed Care – PPO | Admitting: Internal Medicine

## 2019-10-18 DIAGNOSIS — R748 Abnormal levels of other serum enzymes: Secondary | ICD-10-CM | POA: Diagnosis not present

## 2019-10-18 DIAGNOSIS — E781 Pure hyperglyceridemia: Secondary | ICD-10-CM | POA: Diagnosis not present

## 2019-10-18 DIAGNOSIS — K824 Cholesterolosis of gallbladder: Secondary | ICD-10-CM

## 2019-10-18 DIAGNOSIS — Z6837 Body mass index (BMI) 37.0-37.9, adult: Secondary | ICD-10-CM

## 2019-10-18 DIAGNOSIS — K760 Fatty (change of) liver, not elsewhere classified: Secondary | ICD-10-CM

## 2019-10-18 DIAGNOSIS — I10 Essential (primary) hypertension: Secondary | ICD-10-CM

## 2019-10-18 DIAGNOSIS — K769 Liver disease, unspecified: Secondary | ICD-10-CM

## 2019-10-18 NOTE — Patient Instructions (Addendum)
We will try to get MRI approved of liver to evaluate elevated liver functions and abnormal gallbladder ultrasound. Patient may need GI consultation.

## 2019-10-18 NOTE — Progress Notes (Signed)
   Subjective:    Patient ID: Jesus Roy, male    DOB: 02-23-1975, 44 y.o.   MRN: [id number]  HPI  44 year Male seen today via phone visit. He is agreeable to visit in this format today. He is identified using 2 identifiers as Jesus Roy. Rands, a patient in this practice. Patient has history of hypertension, insomnia, GE reflux, hyperlipidemia.  Medications reviewed include alprazolam at bedtime for insomnia, amlodipine 5 mg daily, Nexium 40 mg daily for GE reflux, fenofibrate 160 mg daily HCTZ 25 mg daily losartan 100 mg daily and potassium supplement.  He was seen in August for health maintenance exam and was found to have elevated liver functions. He is overweight. He weighed 262 pounds in January 2021 and 258 pounds in August 2021. BMI in August was 37.02. His blood pressures under excellent control on current regimen. His lipids are reasonably well controlled as well. Fasting glucose was 106 in August. However, SGOT was 79 and SGPT was 146. This was concerning. In July 2020, SGOT was 26 and SGPT 53. An ultrasound was ordered of his gallbladder. There are hyperechoic foci without acoustic shadowing or mobility along the gallbladder lumen measuring up to 8 mm and likely representing a gallbladder polyp. Liver a couple of pericholecystic hypoechoic regions measuring 1.6 x 1.3 x 1.7 cm and 1.3 x 1 x 1.6 cm. Radiologist was concerned that patient might have hepatic lesions but felt that patient likely had fatty liver. Also thought to have an 8 mm gallbladder polyp. An MRI of the liver was recommended.  This was explained to patient today and he is agreeable to have this testing done.    Review of Systems patient takes occasional Advil and Tylenol for musculoskeletal pain but not on a daily basis. He does not consume alcohol at all.  Says he has lost about 8 pounds recently.     Objective:   Physical Exam  Not seen in person today      Assessment & Plan:  Elevated liver  functions-? Fatty liver rule out occult malignancy. Try to get MR of the liver approved.  Gallbladder polyp-not likely because the elevated liver functions  Elevated BMI-likely causing fatty liver infiltration  Essential hypertension-stable on current regimen  Hyperlipidemia treated with fenofibrate  GE reflux treated with Nexium  Insomnia treated with Xanax  Plan: Try to get MRI approved. May well need GI consult for elevated liver functions.  Patient is agreeable to proceeding with MRI.  Time spent with this visit including reviewing medical records, medical decision making and communicating results with patient is 20 minutes

## 2019-10-19 ENCOUNTER — Telehealth: Payer: Self-pay

## 2019-10-19 NOTE — Telephone Encounter (Signed)
Left voicemail MRI abd got approved good from 10/18/19-1016/2021 approval number [id number]. He needs to call [facility] to schedule appt

## 2019-11-13 ENCOUNTER — Ambulatory Visit
Admission: RE | Admit: 2019-11-13 | Discharge: 2019-11-13 | Disposition: A | Discharge status: Home / Self Care | Payer: BC Managed Care – PPO | Source: Ambulatory Visit | Attending: Internal Medicine | Admitting: Internal Medicine

## 2019-11-13 ENCOUNTER — Other Ambulatory Visit: Payer: Self-pay

## 2019-11-13 DIAGNOSIS — K760 Fatty (change of) liver, not elsewhere classified: Secondary | ICD-10-CM

## 2019-11-13 DIAGNOSIS — D1803 Hemangioma of intra-abdominal structures: Secondary | ICD-10-CM | POA: Diagnosis not present

## 2019-11-13 DIAGNOSIS — K824 Cholesterolosis of gallbladder: Secondary | ICD-10-CM

## 2019-11-13 DIAGNOSIS — K769 Liver disease, unspecified: Secondary | ICD-10-CM

## 2019-11-13 DIAGNOSIS — K7689 Other specified diseases of liver: Secondary | ICD-10-CM | POA: Diagnosis not present

## 2019-11-13 DIAGNOSIS — N1330 Unspecified hydronephrosis: Secondary | ICD-10-CM | POA: Diagnosis not present

## 2019-11-13 MED ORDER — GADOBENATE DIMEGLUMINE 529 MG/ML IV SOLN
20.0000 mL | Freq: Once | INTRAVENOUS | Status: AC | PRN
  Administered 2019-11-13: 20 mL via INTRAVENOUS

## 2019-11-16 ENCOUNTER — Other Ambulatory Visit: Payer: Self-pay

## 2019-11-16 ENCOUNTER — Encounter: Payer: Self-pay | Admitting: Internal Medicine

## 2019-11-16 ENCOUNTER — Ambulatory Visit (INDEPENDENT_AMBULATORY_CARE_PROVIDER_SITE_OTHER): Payer: BC Managed Care – PPO | Admitting: Internal Medicine

## 2019-11-16 VITALS — BP 110/70 | HR 79 | Temp 98.3°F | Ht 70.0 in | Wt 250.0 lb

## 2019-11-16 DIAGNOSIS — K824 Cholesterolosis of gallbladder: Secondary | ICD-10-CM | POA: Diagnosis not present

## 2019-11-16 DIAGNOSIS — R102 Pelvic and perineal pain: Secondary | ICD-10-CM

## 2019-11-16 DIAGNOSIS — K760 Fatty (change of) liver, not elsewhere classified: Secondary | ICD-10-CM | POA: Diagnosis not present

## 2019-11-16 DIAGNOSIS — Z6835 Body mass index (BMI) 35.0-35.9, adult: Secondary | ICD-10-CM

## 2019-11-16 DIAGNOSIS — R748 Abnormal levels of other serum enzymes: Secondary | ICD-10-CM

## 2019-11-16 DIAGNOSIS — D1803 Hemangioma of intra-abdominal structures: Secondary | ICD-10-CM

## 2019-11-16 DIAGNOSIS — I10 Essential (primary) hypertension: Secondary | ICD-10-CM

## 2019-11-16 NOTE — Progress Notes (Signed)
° °  Subjective:    Patient ID: Jesus Roy, male    DOB: 27-Nov-1975, 43 y.o.   MRN: [id number]  HPI 44 year old Male here to discuss MRI of liver.  He was here August for health maintenance exam.  History of essential hypertension treated with amlodipine, losartan HCTZ.  History of elevated BMI.  At that time BMI was 37.2.  He has started on a weight loss program and currently BMI is down to 35.87.  He has lost 8 pounds.  He was found to have elevated liver functions in August and these were repeated in September after a trial off of anti-inflammatory medications.  He does not consume alcohol at all.  He had ultrasound of the liver and gallbladder September 14 showing hyperechoic foci consistent with possible gallbladder polyp.  He was found to have hepatic steatosis.  However hepatic lesions could not be ruled out.  MRI of the abdomen was recommended with hepatic protocol.  Was found to have an 8 mm gallbladder polyp.  MRI of abdomen was done October 12 showing fatty liver infiltration.  This was discussed at length with patient today.  He has marked hepatic steatosis.  He has small gallbladder polyps the largest being 7 mm toward the neck of his gallbladder.  His pancreas is normal.  His spleen is normal.  He has no aortic dilatation.  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  There is a recommendation for follow-up ultrasound in 1 year for gallbladder polyps.  There are scattered small lymph nodes surrounding the upper abdomen that are nonspecific that could be due to early liver disease and/or marked hepatic steatosis.  In any case I think the best plan is for him to lose weight and we will follow-up with repeat liver functions in 6 months.    Review of Systems he is having some mild suprapubic pressure.  He took one of his wife's Azo-Standard tablets this morning.  Cannot do urine dipstick.  He was wondering if it had to do with contrast media from MRI that do not think so.   He denies dysuria or frequency.     Objective:   Physical Exam Blood pressure is 110/70 and stable on current medications pulse 79 temperature 98.3 degrees pulse oximetry 97% weight 250 pounds BMI 35.87.  He has some very mild tenderness over his mid bladder area.  There is no rebound tenderness.       Assessment & Plan:  Hepatic steatosis-started diet and exercise regimen  Essential hypertension-stable on current regimen  Multiple gallbladder polyps-benign can be reevaluated in 1 year if necessary  Suprapubic pressure/pain not clear what is causing this today.  Could not get urine specimen because he took Azo-Standard  BMI 35.87 continue to work on diet exercise and weight loss  Plan: Monitor suprapubic pressure/pain and call if symptoms worsen.  Continue with diet exercise and weight loss efforts which will hopefully reverse hepatic steatosis.  Continue current blood pressure medications and follow-up in 6 months.

## 2019-11-16 NOTE — Patient Instructions (Addendum)
It was a pleasure to see you today. Continue with weight loss efforts and RTC in 6 months.  Continue current medications.

## 2020-04-15 ENCOUNTER — Other Ambulatory Visit: Payer: BC Managed Care – PPO | Admitting: Internal Medicine

## 2020-04-18 ENCOUNTER — Ambulatory Visit: Payer: BC Managed Care – PPO | Admitting: Internal Medicine

## 2020-05-02 ENCOUNTER — Other Ambulatory Visit: Payer: Self-pay | Admitting: Internal Medicine

## 2020-05-02 DIAGNOSIS — G4709 Other insomnia: Secondary | ICD-10-CM

## 2020-06-02 ENCOUNTER — Other Ambulatory Visit: Payer: BC Managed Care – PPO | Admitting: Internal Medicine

## 2020-06-05 ENCOUNTER — Ambulatory Visit: Payer: BC Managed Care – PPO | Admitting: Internal Medicine

## 2020-06-06 ENCOUNTER — Ambulatory Visit: Payer: BC Managed Care – PPO | Admitting: Internal Medicine

## 2020-06-09 ENCOUNTER — Other Ambulatory Visit: Payer: BC Managed Care – PPO | Admitting: Internal Medicine

## 2020-06-09 ENCOUNTER — Other Ambulatory Visit: Payer: Self-pay

## 2020-06-09 DIAGNOSIS — E781 Pure hyperglyceridemia: Secondary | ICD-10-CM

## 2020-06-09 LAB — LIPID PANEL
Cholesterol: 175 mg/dL (ref <200)
HDL: 48 mg/dL (ref >=40)
LDL Cholesterol (Calc): 107 mg/dL (calc) — ABNORMAL HIGH
Non-HDL Cholesterol (Calc): 127 mg/dL (calc) (ref <130)
Total CHOL/HDL Ratio: 3.6 (calc) (ref <5.0)
Triglycerides: 103 mg/dL (ref <150)

## 2020-06-09 LAB — HEPATIC FUNCTION PANEL
AG Ratio: 2.2 (calc) (ref 1.0–2.5)
ALT: 52 U/L — ABNORMAL HIGH (ref 9–46)
AST: 31 U/L (ref 10–40)
Albumin: 4.8 g/dL (ref 3.6–5.1)
Alkaline phosphatase (APISO): 70 U/L (ref 36–130)
Bilirubin, Direct: 0.2 mg/dL (ref 0.0–0.2)
Globulin: 2.2 g/dL (calc) (ref 1.9–3.7)
Indirect Bilirubin: 0.5 mg/dL (calc) (ref 0.2–1.2)
Total Bilirubin: 0.7 mg/dL (ref 0.2–1.2)
Total Protein: 7 g/dL (ref 6.1–8.1)

## 2020-06-12 ENCOUNTER — Encounter: Payer: Self-pay | Admitting: Internal Medicine

## 2020-06-12 ENCOUNTER — Other Ambulatory Visit: Payer: Self-pay

## 2020-06-12 ENCOUNTER — Ambulatory Visit (INDEPENDENT_AMBULATORY_CARE_PROVIDER_SITE_OTHER): Payer: BC Managed Care – PPO | Admitting: Internal Medicine

## 2020-06-12 VITALS — BP 120/80 | HR 88 | Ht 70.0 in | Wt 256.0 lb

## 2020-06-12 DIAGNOSIS — D1803 Hemangioma of intra-abdominal structures: Secondary | ICD-10-CM | POA: Diagnosis not present

## 2020-06-12 DIAGNOSIS — I10 Essential (primary) hypertension: Secondary | ICD-10-CM

## 2020-06-12 DIAGNOSIS — K824 Cholesterolosis of gallbladder: Secondary | ICD-10-CM

## 2020-06-12 DIAGNOSIS — E781 Pure hyperglyceridemia: Secondary | ICD-10-CM

## 2020-06-12 DIAGNOSIS — K760 Fatty (change of) liver, not elsewhere classified: Secondary | ICD-10-CM

## 2020-06-12 DIAGNOSIS — Z87891 Personal history of nicotine dependence: Secondary | ICD-10-CM

## 2020-06-12 DIAGNOSIS — Z6836 Body mass index (BMI) 36.0-36.9, adult: Secondary | ICD-10-CM

## 2020-06-12 NOTE — Progress Notes (Signed)
   Subjective:    Patient ID: Jesus Roy, male    DOB: Mar 26, 1975, 45 y.o.   MRN: [id number]  HPI Pleasant 45 year old Male in today for follow-up.  He has a history of hepatic steatosis.  In October he had MRI of the liver which confirmed this along with a benign hemangioma in the posterior right hemiliver.  The reason this MRI was done was that he had elevated liver enzymes with SGOT of 78 and SGPT of 138 10 months ago.  These have improved with diet and exercise.  SGOT is now 31 and SGPT 52.  A gallbladder ultrasound was ordered in  September 2021 due to elevated liver function test.  No gallstones were visualized.  He had an 8 mm gallbladder polyp but malignancy cannot be excluded and that is why the MRI was done.  History of essential hypertension and hypertriglyceridemia.  History of GE reflux.  History of insomnia treated with alprazolam.  He is a former smoker but has quit smoking.  Denies alcohol consumption.   Review of Systems Hx of elevated SGPT -but this hands has decreased from 138 to 52 when checked 10 months ago just by watching his diet some     Objective:   Physical Exam BP 120/80 pulse 88 pulse ox 98% Weight 256 pounds BMI 36.73 Gained 6 pounds since last visit. Has been eating sausage.       Assessment & Plan:  Fatty liver-causing elevated liver functions.  Has had MRI of the liver and no malignancy was found.  Needs to watch his diet because elevated liver functions could easily become a problem once again.  This is improved significantly with watching his diet.  However, he has gained 6 pounds since last visit.  He needs to exercise, maintain a consistent strict diet and lose some weight.  He has a history of mildly elevated LDL.  It was 123 in 2019 and is now 107.  HDL is 48, triglycerides 193 and total cholesterol 175.  History of GE reflux treated with Nexium  History of hypertension treated with Norvasc 5 mg daily and losartan 100 mg daily as well as  HCTZ 25 mg daily.  He has hypertriglyceridemia and currently is on fenofibrate 160 mg daily.  Has not wanted to be on statin.  Statin has steadily decreased to 107 when checked on May 9.  3 years ago triglycerides were 224 and decreased to 103 on May 9.  His health maintenance exam is due early August but we can defer that until the fall if he so desires.

## 2020-06-12 NOTE — Patient Instructions (Addendum)
It was a pleasure to see you today.  Continue to work on diet exercise and weight loss.  Weight and dietary consumption affect your liver functions as well as your cholesterol.  Your physical exam is due in August but we can defer that until fall if you so desire.

## 2020-08-26 ENCOUNTER — Other Ambulatory Visit: Payer: Self-pay | Admitting: Internal Medicine

## 2020-08-26 DIAGNOSIS — I10 Essential (primary) hypertension: Secondary | ICD-10-CM

## 2020-09-15 ENCOUNTER — Other Ambulatory Visit: Payer: Self-pay | Admitting: Internal Medicine

## 2020-09-15 DIAGNOSIS — G4709 Other insomnia: Secondary | ICD-10-CM

## 2020-09-19 ENCOUNTER — Other Ambulatory Visit: Payer: Self-pay | Admitting: Internal Medicine

## 2020-09-19 DIAGNOSIS — Z8639 Personal history of other endocrine, nutritional and metabolic disease: Secondary | ICD-10-CM

## 2020-09-19 DIAGNOSIS — I10 Essential (primary) hypertension: Secondary | ICD-10-CM

## 2020-10-20 ENCOUNTER — Other Ambulatory Visit: Payer: Self-pay | Admitting: Internal Medicine

## 2020-10-20 DIAGNOSIS — E781 Pure hyperglyceridemia: Secondary | ICD-10-CM

## 2020-11-05 ENCOUNTER — Other Ambulatory Visit: Payer: Self-pay | Admitting: Internal Medicine

## 2020-11-05 DIAGNOSIS — G4709 Other insomnia: Secondary | ICD-10-CM

## 2020-12-18 ENCOUNTER — Telehealth: Payer: Self-pay

## 2020-12-18 NOTE — Telephone Encounter (Signed)
Patient cancelled his CPE due to testig positive fr covid today. No rx needed at this time he is treating otc. Doing well.

## 2020-12-22 ENCOUNTER — Other Ambulatory Visit: Payer: BC Managed Care – PPO | Admitting: Internal Medicine

## 2020-12-23 ENCOUNTER — Encounter: Payer: BC Managed Care – PPO | Admitting: Internal Medicine

## 2021-01-02 ENCOUNTER — Other Ambulatory Visit: Payer: Self-pay | Admitting: Internal Medicine

## 2021-01-02 DIAGNOSIS — I10 Essential (primary) hypertension: Secondary | ICD-10-CM

## 2021-01-02 DIAGNOSIS — Z8639 Personal history of other endocrine, nutritional and metabolic disease: Secondary | ICD-10-CM

## 2021-03-20 IMAGING — MR MR ABDOMEN WO/W CM MRCP
17 of 20 series · 41 of 48 positions shown · IV contrast (multihance)
Comparison: Ultrasound obtained on October 16, 2019

CLINICAL DATA: Gallbladder polyp, hepatic lesion and hepatic
steatosis.

EXAM:
MRI ABDOMEN WITHOUT AND WITH CONTRAST (INCLUDING MRCP)
TECHNIQUE: Multiplanar multisequence MR imaging of the abdomen was performed
both before and after the administration of intravenous contrast.
Heavily T2-weighted images of the biliary and pancreatic ducts were
obtained, and three-dimensional MRCP images were rendered by post
processing.
CONTRAST:  20mL MULTIHANCE GADOBENATE DIMEGLUMINE 529 MG/ML IV SOLN

[Series 3: T2 · coronal · 5.0mm · 1.56mm/px · 1 of 38 slices shown (1 of 4)]
[im 1/38]
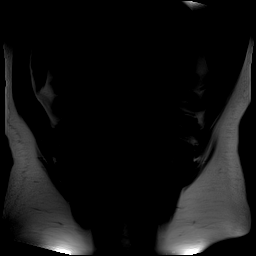

[Series 4: T1 · axial · 3.0mm · 1.19mm/px · z∈[-145,+92]mm · 5 of 160 slices shown]
[im 1/160]
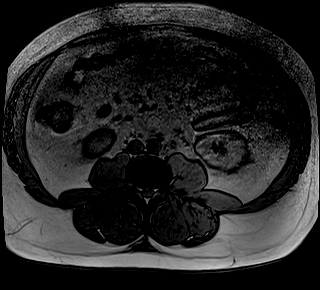
[im 40/160]
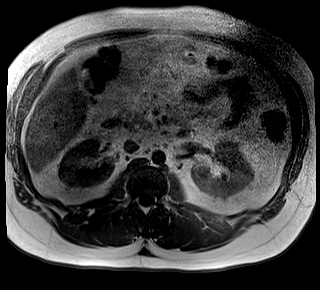
[im 80/160]
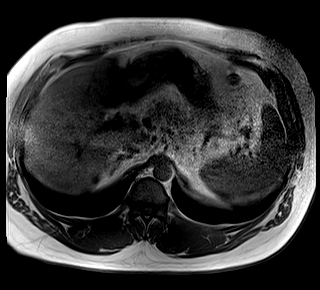
[im 120/160]
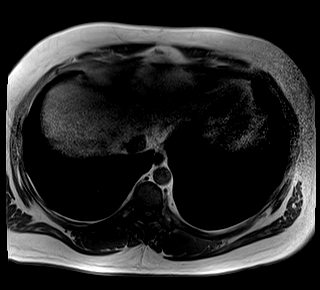
[im 160/160]
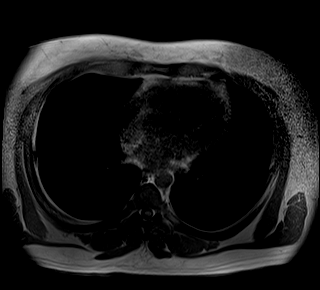

[Series 5: T2 · axial · 5.0mm · 1.56mm/px · z∈[-150,+126]mm · 2 of 47 slices shown (2 of 4)]
[im 1/47]
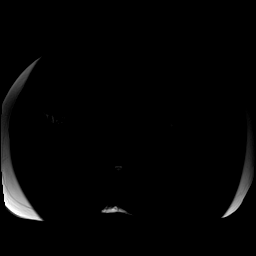
[im 47/47]
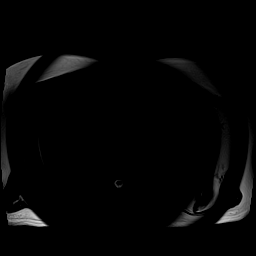

[Series 6: T2 · axial · 6.0mm · 1.22mm/px · 1 of 33 slices shown (3 of 4)]
[im 1/33]
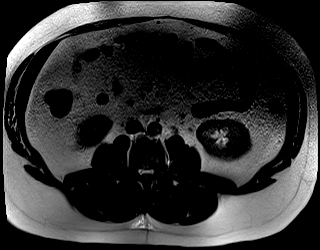

[Series 7: bSSFP · axial · 5.0mm · 1.25mm/px · 1 of 41 slices shown]
[im 1/41]
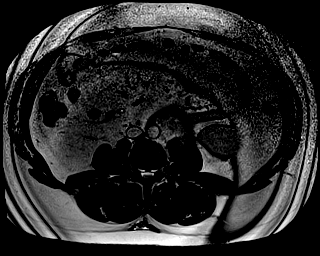

[Series 10: DWI · axial · 5.0mm · 1.49mm/px · z∈[-149,+126]mm · 4 of 135 slices shown (1 of 2)]
[im 1/135]
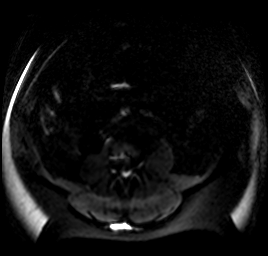
[im 45/135]
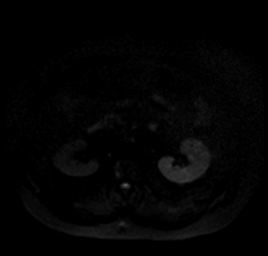
[im 90/135]
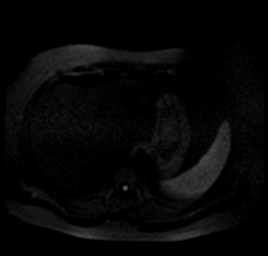
[im 135/135]
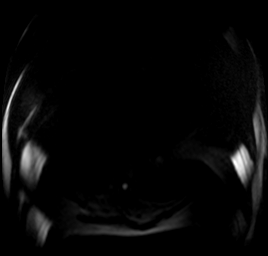

[Series 11: DWI · axial · 5.0mm · 1.49mm/px · 1 of 45 slices shown (2 of 2)]
[im 1/45]
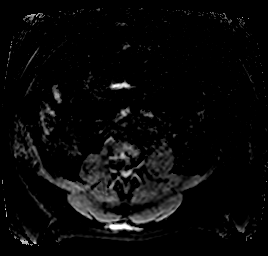

[Series 12: MRCP · coronal · 1.0mm · 0.49mm/px · 2 of 72 slices shown]
[im 1/72]
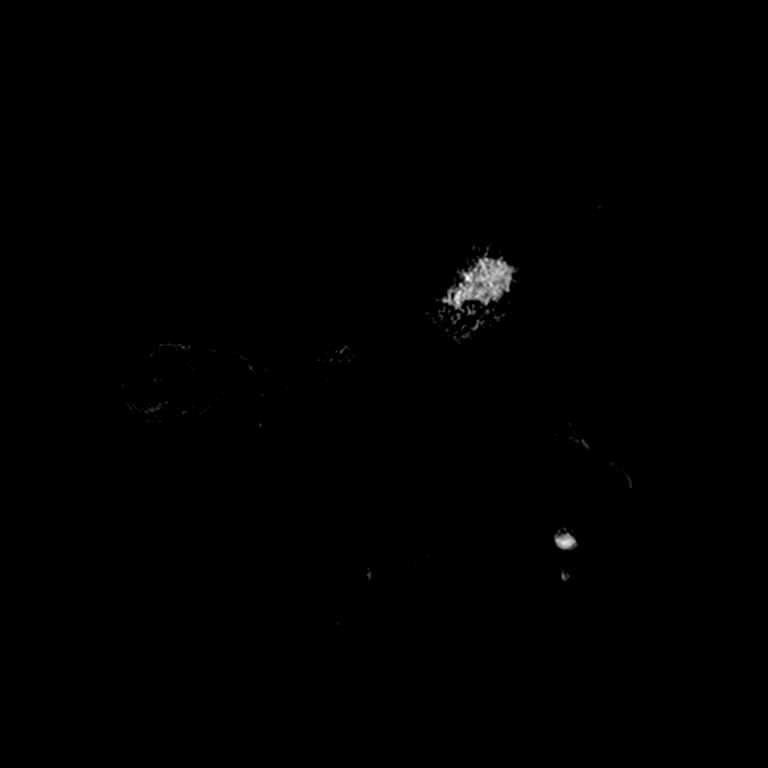
[im 72/72]
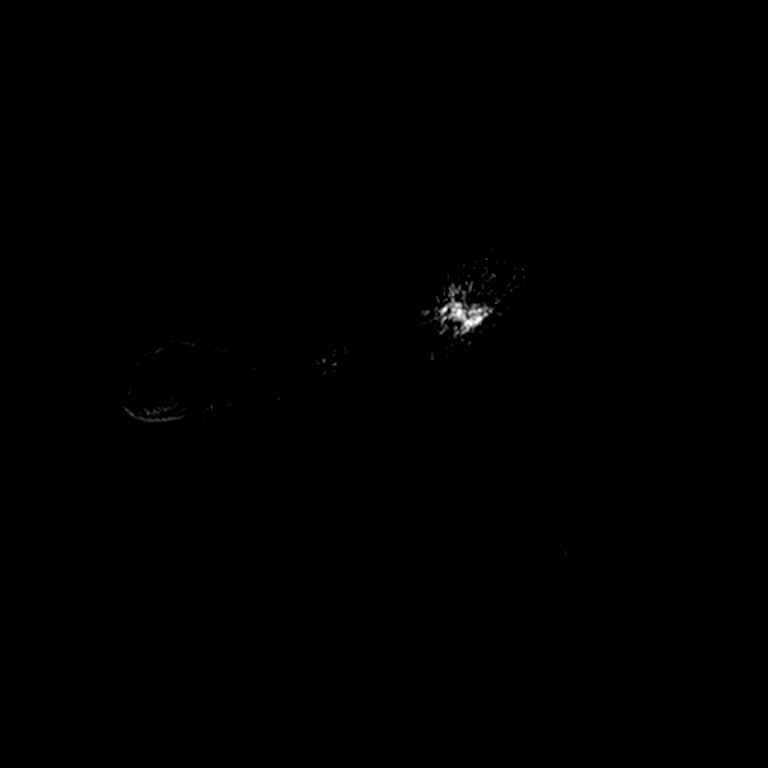

[Series 14: T2 · coronal · 3.0mm · 1.19mm/px · 1 of 27 slices shown (4 of 4)]
[im 1/27]
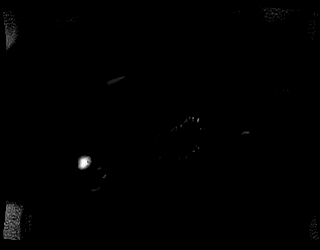

[Series 15: T1 dynamic · axial · non-contrast · 3.0mm · 1.25mm/px · z∈[-154,+107]mm · 3 of 88 slices shown]
[im 1/88]
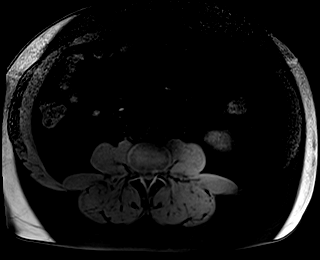
[im 44/88]
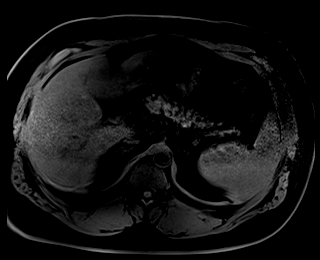
[im 88/88]
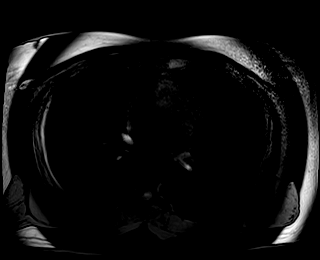

[Series 16: T1 dynamic post-contrast · axial · 3.0mm · 1.25mm/px · z∈[-154,+107]mm · 3 of 88 slices shown (1 of 7)]
[im 1/88]
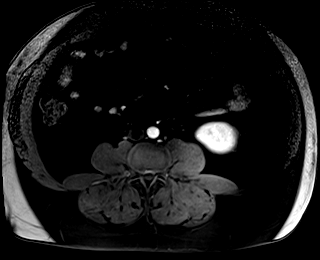
[im 44/88]
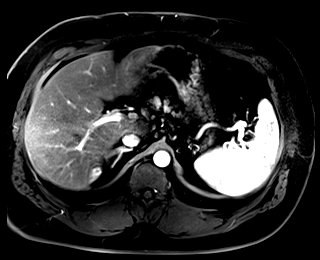
[im 88/88]
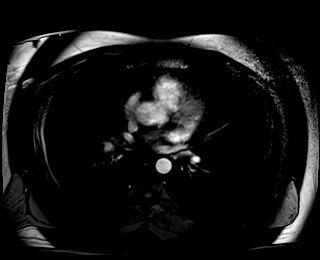

[Series 17: T1 dynamic post-contrast · axial · 3.0mm · 1.25mm/px · z∈[-154,+107]mm · 3 of 88 slices shown (2 of 7)]
[im 1/88]
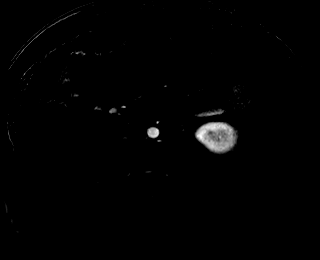
[im 44/88]
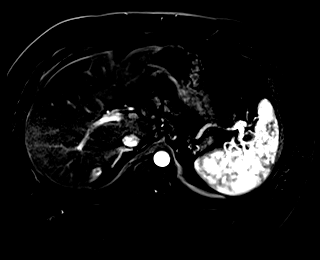
[im 88/88]
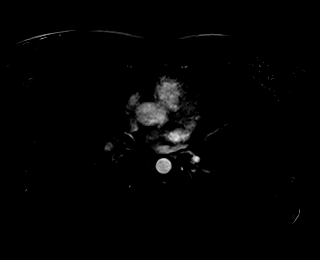

[Series 18: T1 dynamic post-contrast · axial · 3.0mm · 1.25mm/px · z∈[-154,+107]mm · 3 of 88 slices shown (3 of 7)]
[im 1/88]
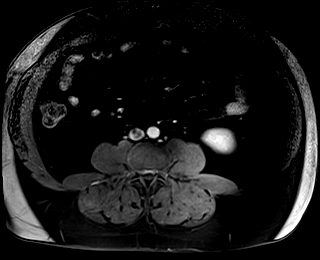
[im 44/88]
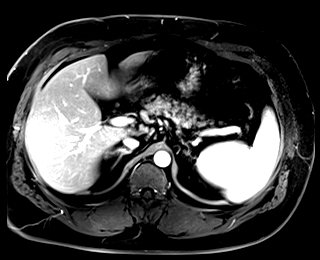
[im 88/88]
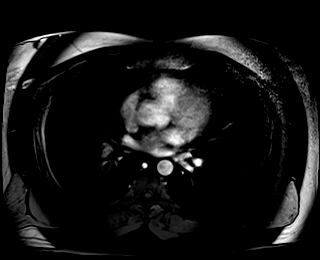

[Series 19: T1 dynamic post-contrast · axial · 3.0mm · 1.25mm/px · z∈[-154,+107]mm · 3 of 88 slices shown (4 of 7)]
[im 1/88]
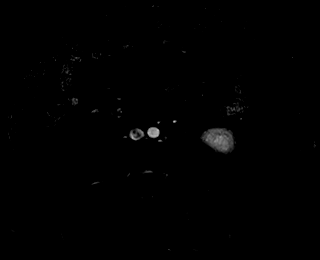
[im 44/88]
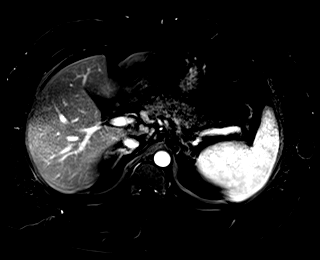
[im 88/88]
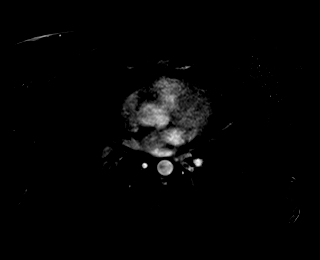

[Series 20: T1 dynamic post-contrast · axial · 3.0mm · 1.25mm/px · z∈[-154,+107]mm · 3 of 88 slices shown (5 of 7)]
[im 1/88]
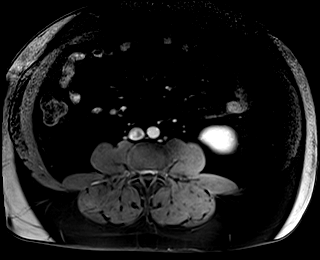
[im 44/88]
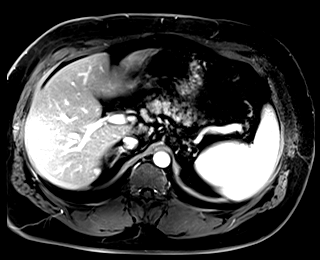
[im 88/88]
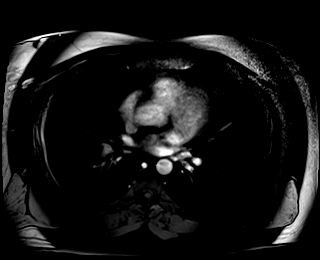

[Series 21: T1 dynamic post-contrast · axial · 3.0mm · 1.25mm/px · z∈[-154,+107]mm · 3 of 88 slices shown (6 of 7)]
[im 1/88]
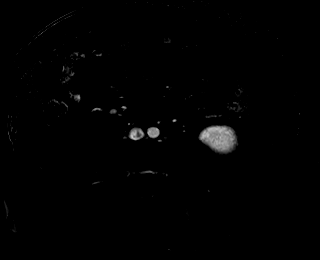
[im 44/88]
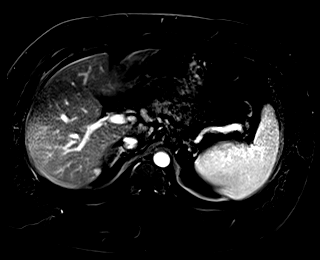
[im 88/88]
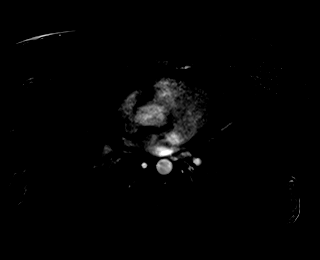

[Series 22: T1 dynamic post-contrast · coronal · 3.0mm · 1.25mm/px · 2 of 72 slices shown (7 of 7)]
[im 1/72]
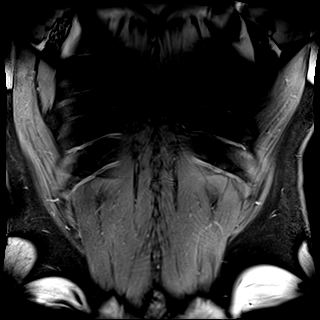
[im 72/72]
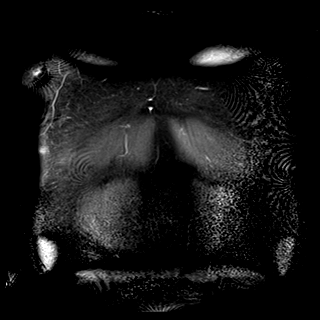

[41 of 48 positions shown; findings below may reference images not displayed]

FINDINGS: Lower chest: Incidental imaging of the lung bases is unremarkable
with limited assessment on MRI. No effusion or sign of
consolidation.

Hepatobiliary: Marked hepatic steatosis with areas of fatty sparing
particularly about the gallbladder fossa. Hemangioma in the
posterior RIGHT hemi liver.

No suspicious focal enhancing lesion. Liver contours are smooth. The
portal vein is patent.

Biliary tree is nondilated.

Multiple small polyps in the gallbladder as exhibited on the recent
sonogram largest may be as large as 7 mm towards the neck of the
gallbladder

Pancreas: Pancreas is normal without ductal dilation or sign of
inflammation. Normal intrinsic T1 signal in the pancreas.

Spleen: Spleen with mildly lobular contours. Splenic size is normal.
No focal, suspicious splenic lesion.

Adrenals/Urinary Tract: Adrenal glands are normal. Symmetric renal
enhancement without signs of hydronephrosis. Small LEFT renal cyst
arises from the upper pole of the LEFT kidney.

Stomach/Bowel: No acute gastrointestinal process to the extent
evaluated on MRI not performed for bowel evaluation.

Vascular/Lymphatic: Vascular structures in the abdomen are patent
without aneurysmal dilation. Scattered small lymph nodes throughout
the upper abdomen, nonspecific in the setting of potential early
liver disease and marked hepatic steatosis.

Other:  No ascites.

Musculoskeletal: No suspicious bone lesions identified.
IMPRESSION: 1. Marked hepatic steatosis with areas of fatty sparing particularly
about the gallbladder fossa.
2. Hemangioma in the posterior RIGHT hemi liver.
3. Multiple small polyps in the gallbladder as exhibited on the
recent sonogram largest may be as large as 7 mm towards the neck of
the gallbladder. Would suggest ultrasound follow-up in 1 year from
the initial evaluation. Differential diagnosis would include small
benign polyp versus is adenoma versus small malignancy.
4. Scattered small lymph nodes throughout the upper abdomen,
nonspecific in the setting of potential early liver disease and
marked hepatic steatosis.

## 2021-04-12 ENCOUNTER — Other Ambulatory Visit: Payer: Self-pay | Admitting: Internal Medicine

## 2021-04-12 DIAGNOSIS — I10 Essential (primary) hypertension: Secondary | ICD-10-CM

## 2021-04-12 DIAGNOSIS — Z8639 Personal history of other endocrine, nutritional and metabolic disease: Secondary | ICD-10-CM

## 2021-06-02 ENCOUNTER — Other Ambulatory Visit: Payer: Self-pay | Admitting: Internal Medicine

## 2021-06-02 DIAGNOSIS — G4709 Other insomnia: Secondary | ICD-10-CM

## 2021-07-20 ENCOUNTER — Other Ambulatory Visit: Payer: Self-pay | Admitting: Internal Medicine

## 2021-07-20 DIAGNOSIS — I10 Essential (primary) hypertension: Secondary | ICD-10-CM

## 2021-07-20 DIAGNOSIS — Z8639 Personal history of other endocrine, nutritional and metabolic disease: Secondary | ICD-10-CM

## 2021-07-27 ENCOUNTER — Other Ambulatory Visit: Payer: 59

## 2021-07-27 DIAGNOSIS — E781 Pure hyperglyceridemia: Secondary | ICD-10-CM

## 2021-07-27 DIAGNOSIS — Z125 Encounter for screening for malignant neoplasm of prostate: Secondary | ICD-10-CM

## 2021-07-27 DIAGNOSIS — R7989 Other specified abnormal findings of blood chemistry: Secondary | ICD-10-CM

## 2021-07-27 DIAGNOSIS — Z131 Encounter for screening for diabetes mellitus: Secondary | ICD-10-CM

## 2021-07-27 DIAGNOSIS — I10 Essential (primary) hypertension: Secondary | ICD-10-CM

## 2021-07-28 LAB — CBC WITH DIFFERENTIAL/PLATELET
Absolute Monocytes: 598 cells/uL (ref 200–950)
Basophils Absolute: 49 cells/uL (ref 0–200)
Basophils Relative: 0.8 %
Eosinophils Absolute: 110 cells/uL (ref 15–500)
Eosinophils Relative: 1.8 %
HCT: 45 % (ref 38.5–50.0)
Hemoglobin: 15.3 g/dL (ref 13.2–17.1)
Lymphs Abs: 2342 cells/uL (ref 850–3900)
MCH: 28.4 pg (ref 27.0–33.0)
MCHC: 34 g/dL (ref 32.0–36.0)
MCV: 83.5 fL (ref 80.0–100.0)
MPV: 10.5 fL (ref 7.5–12.5)
Monocytes Relative: 9.8 %
Neutro Abs: 3001 cells/uL (ref 1500–7800)
Neutrophils Relative %: 49.2 %
Platelets: 310 10*3/uL (ref 140–400)
RBC: 5.39 10*6/uL (ref 4.20–5.80)
RDW: 13.2 % (ref 11.0–15.0)
Total Lymphocyte: 38.4 %
WBC: 6.1 10*3/uL (ref 3.8–10.8)

## 2021-07-28 LAB — LIPID PANEL
Cholesterol: 197 mg/dL (ref <200)
HDL: 49 mg/dL (ref >=40)
LDL Cholesterol (Calc): 125 mg/dL (calc) — ABNORMAL HIGH
Non-HDL Cholesterol (Calc): 148 mg/dL (calc) — ABNORMAL HIGH (ref <130)
Total CHOL/HDL Ratio: 4 (calc) (ref <5.0)
Triglycerides: 123 mg/dL (ref <150)

## 2021-07-28 LAB — COMPLETE METABOLIC PANEL WITH GFR
AG Ratio: 1.8 (calc) (ref 1.0–2.5)
ALT: 61 U/L — ABNORMAL HIGH (ref 9–46)
AST: 36 U/L (ref 10–40)
Albumin: 4.5 g/dL (ref 3.6–5.1)
Alkaline phosphatase (APISO): 64 U/L (ref 36–130)
BUN: 14 mg/dL (ref 7–25)
CO2: 28 mmol/L (ref 20–32)
Calcium: 10.1 mg/dL (ref 8.6–10.3)
Chloride: 102 mmol/L (ref 98–110)
Creat: 0.82 mg/dL (ref 0.60–1.29)
Globulin: 2.5 g/dL (calc) (ref 1.9–3.7)
Glucose, Bld: 104 mg/dL — ABNORMAL HIGH (ref 65–99)
Potassium: 4 mmol/L (ref 3.5–5.3)
Sodium: 139 mmol/L (ref 135–146)
Total Bilirubin: 0.8 mg/dL (ref 0.2–1.2)
Total Protein: 7 g/dL (ref 6.1–8.1)
eGFR: 110 mL/min/{1.73_m2} (ref >=60)

## 2021-07-28 LAB — HEMOGLOBIN A1C
Hgb A1c MFr Bld: 6.1 % of total Hgb — ABNORMAL HIGH (ref <5.7)
Mean Plasma Glucose: 128 mg/dL
eAG (mmol/L): 7.1 mmol/L

## 2021-07-28 LAB — PSA: PSA: 0.14 ng/mL (ref <=4.00)

## 2021-07-28 LAB — TSH: TSH: 4.31 mIU/L (ref 0.40–4.50)

## 2021-07-30 ENCOUNTER — Encounter: Payer: Self-pay | Admitting: Internal Medicine

## 2021-07-30 ENCOUNTER — Ambulatory Visit (INDEPENDENT_AMBULATORY_CARE_PROVIDER_SITE_OTHER): Payer: 59 | Admitting: Internal Medicine

## 2021-07-30 VITALS — BP 128/70 | HR 68 | Ht 69.75 in | Wt 264.5 lb

## 2021-07-30 DIAGNOSIS — Z0000 Encounter for general adult medical examination without abnormal findings: Secondary | ICD-10-CM

## 2021-07-30 DIAGNOSIS — K760 Fatty (change of) liver, not elsewhere classified: Secondary | ICD-10-CM | POA: Diagnosis not present

## 2021-07-30 DIAGNOSIS — Z87891 Personal history of nicotine dependence: Secondary | ICD-10-CM

## 2021-07-30 DIAGNOSIS — I10 Essential (primary) hypertension: Secondary | ICD-10-CM | POA: Diagnosis not present

## 2021-07-30 DIAGNOSIS — E7800 Pure hypercholesterolemia, unspecified: Secondary | ICD-10-CM

## 2021-07-30 DIAGNOSIS — R7401 Elevation of levels of liver transaminase levels: Secondary | ICD-10-CM

## 2021-07-30 DIAGNOSIS — R748 Abnormal levels of other serum enzymes: Secondary | ICD-10-CM

## 2021-07-30 DIAGNOSIS — Z6838 Body mass index (BMI) 38.0-38.9, adult: Secondary | ICD-10-CM

## 2021-07-30 DIAGNOSIS — Z1213 Encounter for screening for malignant neoplasm of small intestine: Secondary | ICD-10-CM

## 2021-07-30 DIAGNOSIS — G4709 Other insomnia: Secondary | ICD-10-CM

## 2021-07-30 DIAGNOSIS — Z1211 Encounter for screening for malignant neoplasm of colon: Secondary | ICD-10-CM | POA: Diagnosis not present

## 2021-07-30 NOTE — Progress Notes (Unsigned)
Subjective:    Patient ID: Jesus Roy, male    DOB: 1975/12/31, 46 y.o.   MRN: [id number]  HPI 46 year old Male seen for health maintenance exam and evaluation of medical issues.Hx hepatic steatosis.  History of obesity.  In 2022 his BMI was 36.73 and is now 38.22.  He has gained about 8 and half pounds over the past year.  His wife has been diagnosed with melanoma and is undergoing treatment.  There has been situational stress and he has not been dieting and exercising as he should.  His fasting glucose is 104.  He has a SG PT is 61.  His LDL cholesterol is 125 although his triglycerides are normal and his total cholesterol is normal.  His HDL cholesterol was 49.  His hemoglobin A1c is 6.1%.  He does take fenofibrate for hypertriglyceridemia.  He takes losartan 100 mg daily, HCTZ 25 mg daily, and amlodipine 5 mg daily for hypertension.  He takes potassium supplementation 20 mEq daily.  Prescription given for Xanax to take sparingly at bedtime as needed for anxiety and insomnia.  His waist circumference is greater than 49 inches.   He is allergic to Penicillin.  It causes a rash.  History of mild attention deficit issues but does not take medication at present time.  Past medical history: Fractured left radial head 1992, ganglion cyst removed from left wrist 2005, left foot surgery by podiatrist February 1999.  Social history: He is married.  Former smoker.  Denies alcohol consumption.  1 stepson age 77.  Works at an [employer] in [employer].  Family history: Father with history of hypertension and hyperlipidemia.  1 brother in good health.  Mother in good health with history of hypothyroidism.    Review of Systems See above main issue is concern for his wife at the present time    Objective:   Physical Exam Blood pressure excellent at 128/70, weight is 264 pounds 8 ounces, BMI is 38.22, pulse oximetry 98%, height is 5 feet 9.75 inches  Skin: Warm and dry.  No  cervical adenopathy or thyromegaly.  No carotid bruits.  Chest clear to auscultation.  Cardiac exam: Regular rate and rhythm.  No ectopy or murmur.  Abdomen obese soft nondistended without hepatosplenomegaly masses or tenderness.  No lower extremity pitting edema.  Neuro is intact without gross focal deficits.       Assessment & Plan:  Essential hypertension stable on current regimen of losartan 100 mg daily, amlodipine 5 mg daily and HCTZ 25 mg daily.  BMI 38.22-needs to work on diet and exercise.  He has gained only about 6 pounds in the past year but BMI should be much less.  He does not want to see dietitian.  Hyperlipidemia-has elevated LDL of 125.  He is on fenofibrate for history of hypertriglyceridemia.  His HDL has improved from 33  to 49 which is good.  Total cholesterol is normal.  Insomnia treated with Xanax at bedtime.  GE reflux treated with Nexium in the past but currently not taking this as a prescription medication.  Plan: Patient and I have agreed that he will return in 4 months for follow-up on lipids with fasting lipid panel and liver functions.  He will work on diet exercise and weight loss.  He agrees to [employer].  Order will be placed.  Also order placed for CT coronary scoring.  No change in his antihypertensive medications at this point.  Vaccines discussed.  His tetanus immunization  is up-to-date having been given in 2016.

## 2021-08-31 NOTE — Patient Instructions (Signed)
Patient agrees to work on diet exercise and weight loss.  He agrees to [employer] and CT coronary scoring.  No change in antihypertensive medications at this point.  Vaccines discussed.  Tetanus immunization is up-to-date.  Return in 4 months for follow-up with lipid panel and liver functions.  Situational stress discussed with wife's illness.

## 2021-09-17 ENCOUNTER — Ambulatory Visit (HOSPITAL_COMMUNITY)
Admission: RE | Admit: 2021-09-17 | Discharge: 2021-09-17 | Disposition: A | Discharge status: Home / Self Care | Payer: Self-pay | Source: Ambulatory Visit | Attending: Internal Medicine | Admitting: Internal Medicine

## 2021-09-17 DIAGNOSIS — E7800 Pure hypercholesterolemia, unspecified: Secondary | ICD-10-CM | POA: Insufficient documentation

## 2021-09-26 ENCOUNTER — Other Ambulatory Visit: Payer: Self-pay | Admitting: Internal Medicine

## 2021-09-26 DIAGNOSIS — G4709 Other insomnia: Secondary | ICD-10-CM

## 2021-10-06 ENCOUNTER — Other Ambulatory Visit: Payer: Self-pay | Admitting: Internal Medicine

## 2021-10-06 DIAGNOSIS — I10 Essential (primary) hypertension: Secondary | ICD-10-CM

## 2021-11-06 ENCOUNTER — Other Ambulatory Visit: Payer: Self-pay | Admitting: Internal Medicine

## 2021-11-06 DIAGNOSIS — I10 Essential (primary) hypertension: Secondary | ICD-10-CM

## 2021-11-06 DIAGNOSIS — Z8639 Personal history of other endocrine, nutritional and metabolic disease: Secondary | ICD-10-CM

## 2021-11-06 DIAGNOSIS — E781 Pure hyperglyceridemia: Secondary | ICD-10-CM

## 2021-12-21 ENCOUNTER — Other Ambulatory Visit: Payer: 59

## 2021-12-21 DIAGNOSIS — R7302 Impaired glucose tolerance (oral): Secondary | ICD-10-CM

## 2021-12-21 DIAGNOSIS — E781 Pure hyperglyceridemia: Secondary | ICD-10-CM

## 2021-12-21 DIAGNOSIS — E7800 Pure hypercholesterolemia, unspecified: Secondary | ICD-10-CM

## 2021-12-21 DIAGNOSIS — K760 Fatty (change of) liver, not elsewhere classified: Secondary | ICD-10-CM

## 2021-12-22 ENCOUNTER — Ambulatory Visit: Payer: 59 | Admitting: Internal Medicine

## 2021-12-22 LAB — HEPATIC FUNCTION PANEL
AG Ratio: 1.8 (calc) (ref 1.0–2.5)
ALT: 37 U/L (ref 9–46)
AST: 24 U/L (ref 10–40)
Albumin: 4.4 g/dL (ref 3.6–5.1)
Alkaline phosphatase (APISO): 57 U/L (ref 36–130)
Bilirubin, Direct: 0.1 mg/dL (ref 0.0–0.2)
Globulin: 2.5 g/dL (calc) (ref 1.9–3.7)
Indirect Bilirubin: 0.4 mg/dL (calc) (ref 0.2–1.2)
Total Bilirubin: 0.5 mg/dL (ref 0.2–1.2)
Total Protein: 6.9 g/dL (ref 6.1–8.1)

## 2021-12-22 LAB — LIPID PANEL
Cholesterol: 174 mg/dL (ref <200)
HDL: 50 mg/dL (ref >=40)
LDL Cholesterol (Calc): 104 mg/dL (calc) — ABNORMAL HIGH
Non-HDL Cholesterol (Calc): 124 mg/dL (calc) (ref <130)
Total CHOL/HDL Ratio: 3.5 (calc) (ref <5.0)
Triglycerides: 104 mg/dL (ref <150)

## 2021-12-22 LAB — HEMOGLOBIN A1C
Hgb A1c MFr Bld: 5.9 % of total Hgb — ABNORMAL HIGH (ref <5.7)
Mean Plasma Glucose: 123 mg/dL
eAG (mmol/L): 6.8 mmol/L

## 2021-12-23 ENCOUNTER — Encounter: Payer: Self-pay | Admitting: Internal Medicine

## 2021-12-23 ENCOUNTER — Ambulatory Visit: Payer: 59 | Admitting: Internal Medicine

## 2021-12-23 VITALS — BP 126/72 | HR 63 | Temp 98.9°F | Ht 70.5 in | Wt 249.8 lb

## 2021-12-23 DIAGNOSIS — Z139 Encounter for screening, unspecified: Secondary | ICD-10-CM

## 2021-12-23 DIAGNOSIS — Z6835 Body mass index (BMI) 35.0-35.9, adult: Secondary | ICD-10-CM

## 2021-12-23 DIAGNOSIS — I10 Essential (primary) hypertension: Secondary | ICD-10-CM | POA: Diagnosis not present

## 2021-12-23 DIAGNOSIS — Z7185 Encounter for immunization safety counseling: Secondary | ICD-10-CM | POA: Diagnosis not present

## 2021-12-23 DIAGNOSIS — E7800 Pure hypercholesterolemia, unspecified: Secondary | ICD-10-CM

## 2021-12-23 DIAGNOSIS — Z23 Encounter for immunization: Secondary | ICD-10-CM

## 2021-12-23 LAB — POCT URINALYSIS DIPSTICK
Bilirubin, UA: NEGATIVE
Blood, UA: NEGATIVE
Glucose, UA: NEGATIVE
Ketones, UA: NEGATIVE
Leukocytes, UA: NEGATIVE
Nitrite, UA: NEGATIVE
Protein, UA: NEGATIVE
Spec Grav, UA: 1.01 (ref 1.010–1.025)
Urobilinogen, UA: 0.2 E.U./dL
pH, UA: 7.5 (ref 5.0–8.0)

## 2021-12-23 NOTE — Progress Notes (Signed)
   Subjective:    Patient ID: Jesus Roy, male    DOB: [birth year], 46 y.o.   MRN: [id number]  HPI 46 year old Male seen today for 62-monthfollow-up.  He was seen in June for health maintenance exam and evaluation of medical issues.  He has a history of hepatic steatosis and history of obesity.  It was noted he had gained 8 and half pounds over the past year.  [facility]  He has a history of hypertriglyceridemia and has been taking fenofibrate.  He takes losartan HCT as well as amlodipine for hypertension.  He takes a potassium supplement as he is on a diuretic.  His weight in June was 264 pounds 8 ounces and BMI was 38.22       Review of Systems he feels well and has no new complaints     Objective:   Physical Exam Blood pressure 126/72 which is excellent, pulse 63, temperature 98.9 degrees pulse oximetry 98% on room air weight is 249 pounds 12.8 ounces height is 5 feet 10.5 inches BMI is 35.34.  Therefore, he has lost 14.75 pounds with diet and exercise.  I am pleased with his progress. His hemoglobin A1c has improved from 6.1% to 5.9%.  His LDL cholesterol has improved from 125 mg to 104 currently.  Total cholesterol has improved from 197 to 174.  Triglycerides have improved from 123 to 104.  Neck is supple.  No carotid bruits.  Chest clear.  Cardiac exam: Regular rate and rhythm.  No lower extremity edema.      Assessment & Plan:   Essential hypertension-stable on losartan, HCTZ and amlodipine.  History of mixed hyperlipidemia treated with fenofibrate 160 mg daily, diet and exercise and stable as well as improved  BMI improved from 38.22 to 35.34 with diet and exercise  History of hypokalemia on diuretic-treated with potassium supplement 20 mEq daily  Plan: Continue current regimen.  Patient congratulated on weight loss and improvement with lipids.  Blood pressure is stable.  Flu vaccine given.  No change in  medications.  Follow-up in July.

## 2021-12-26 NOTE — Patient Instructions (Addendum)
Congratulations on weight loss and improvement with lab studies.  Continue diet and exercise efforts.  Follow-up in July for health maintenance exam.  Flu vaccine given.

## 2022-01-18 ENCOUNTER — Other Ambulatory Visit: Payer: Self-pay | Admitting: Internal Medicine

## 2022-01-18 DIAGNOSIS — I10 Essential (primary) hypertension: Secondary | ICD-10-CM

## 2022-01-19 ENCOUNTER — Other Ambulatory Visit: Payer: Self-pay | Admitting: Internal Medicine

## 2022-01-19 DIAGNOSIS — G4709 Other insomnia: Secondary | ICD-10-CM

## 2022-02-16 ENCOUNTER — Other Ambulatory Visit: Payer: Self-pay | Admitting: Internal Medicine

## 2022-02-16 DIAGNOSIS — I10 Essential (primary) hypertension: Secondary | ICD-10-CM

## 2022-02-16 DIAGNOSIS — Z8639 Personal history of other endocrine, nutritional and metabolic disease: Secondary | ICD-10-CM

## 2022-02-16 DIAGNOSIS — E781 Pure hyperglyceridemia: Secondary | ICD-10-CM

## 2022-05-16 ENCOUNTER — Other Ambulatory Visit: Payer: Self-pay | Admitting: Internal Medicine

## 2022-05-16 DIAGNOSIS — I10 Essential (primary) hypertension: Secondary | ICD-10-CM

## 2022-05-16 DIAGNOSIS — Z8639 Personal history of other endocrine, nutritional and metabolic disease: Secondary | ICD-10-CM

## 2022-05-16 DIAGNOSIS — E781 Pure hyperglyceridemia: Secondary | ICD-10-CM

## 2022-06-08 ENCOUNTER — Other Ambulatory Visit: Payer: Self-pay | Admitting: Internal Medicine

## 2022-06-08 DIAGNOSIS — G4709 Other insomnia: Secondary | ICD-10-CM

## 2022-08-03 NOTE — Progress Notes (Signed)
Patient Care Team: Margaree Mackintosh, MD as PCP - General (Internal Medicine)  Visit Date: 08/10/22  Subjective:    Patient ID: Jesus Roy , Male   DOB: [birth year], 47 y.o.    MRN: [id number]   47 y.o. Male presents today for annual comprehensive physical exam.  Has some right knee stiffness after sitting for long periods.  History of hypertension treated with amlodipine 5 mg daily, hydrochlorothiazide 25 mg daily, losartan 100 mg daily. Blood pressure normal today at 128/82.  History of hypertriglyceridemia treated with fenofibrate 160 mg daily. LDL elevated at 106.  Hx hepatic steatosis.  History of obesity.  In 2022 his BMI was 36.73 and is now 38.22.  He has gained about 8 and half pounds over the past year.  His wife has been diagnosed with melanoma and is undergoing treatment.  There has been situational stress and he has not been dieting and exercising as he should.  He takes potassium supplementation 20 mEq daily.  Prescription given for Xanax to take sparingly at bedtime as needed for anxiety and insomnia.  His waist circumference is greater than 49 inches. He is allergic to Penicillin.  It causes a rash.  History of mild attention deficit issues but does not take medication at present time.  Past medical history: Fractured left radial head 1992, ganglion cyst removed from left wrist 2005, left foot surgery by podiatrist February 1999.  Discussed need for colonoscopy.  Glucose elevated at 103. Kidney, liver functions normal. Electrolytes normal. Blood proteins normal. CBC normal. Lost 10 pounds between 07/30/21 and today.  Social history: He is married.  Former smoker.  Denies alcohol consumption.  1 stepson age 41.  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  Family history: Father with history of hypertension and hyperlipidemia.  1 brother in good health.  Mother in good health with history of hypothyroidism.  History reviewed. No pertinent past medical  history.   Family History  Problem Relation Age of Onset   Hypertension Father    Hyperlipidemia Father     Social Hx: Married. Denies ETOH consumption. Former smoker     Review of Systems  Constitutional:  Negative for chills, fever, malaise/fatigue and weight loss.  HENT:  Negative for hearing loss, sinus pain and sore throat.   Respiratory:  Negative for cough, hemoptysis and shortness of breath.   Cardiovascular:  Negative for chest pain, palpitations, leg swelling and PND.  Gastrointestinal:  Negative for abdominal pain, constipation, diarrhea, heartburn, nausea and vomiting.  Genitourinary:  Negative for dysuria, frequency and urgency.  Musculoskeletal:  Negative for back pain, myalgias and neck pain.  Skin:  Negative for itching and rash.  Neurological:  Negative for dizziness, tingling, seizures and headaches.  Endo/Heme/Allergies:  Negative for polydipsia.  Psychiatric/Behavioral:  Negative for depression. The patient is not nervous/anxious.         Objective:   Vitals: BP 128/82   Pulse 72   Resp 16   Ht 5' 10.5" (1.791 m)   Wt 254 lb (115.2 kg)   SpO2 96%   BMI 35.93 kg/m    Physical Exam Vitals and nursing note reviewed.  Constitutional:      General: He is awake. He is not in acute distress.    Appearance: Normal appearance. He is not ill-appearing or toxic-appearing.  HENT:     Head: Normocephalic and atraumatic.     Right Ear: Tympanic membrane, ear canal and external ear normal.  Left Ear: Tympanic membrane, ear canal and external ear normal.     Mouth/Throat:     Pharynx: Oropharynx is clear.  Eyes:     Extraocular Movements: Extraocular movements intact.     Pupils: Pupils are equal, round, and reactive to light.  Neck:     Thyroid: No thyroid mass, thyromegaly or thyroid tenderness.     Vascular: No carotid bruit.  Cardiovascular:     Rate and Rhythm: Normal rate and regular rhythm. No extrasystoles are present.    Pulses:           Dorsalis pedis pulses are 1+ on the right side and 1+ on the left side.     Heart sounds: Normal heart sounds. No murmur heard.    No friction rub. No gallop.  Pulmonary:     Effort: Pulmonary effort is normal.     Breath sounds: Normal breath sounds. No decreased breath sounds, wheezing, rhonchi or rales.  Chest:     Chest wall: No mass.  Abdominal:     Palpations: Abdomen is soft.     Tenderness: There is no abdominal tenderness.     Hernia: No hernia is present.  Musculoskeletal:     Cervical back: Normal range of motion.     Right lower leg: No edema.     Left lower leg: No edema.  Lymphadenopathy:     Cervical: No cervical adenopathy.     Upper Body:     Right upper body: No supraclavicular adenopathy.     Left upper body: No supraclavicular adenopathy.  Skin:    General: Skin is warm and dry.  Neurological:     General: No focal deficit present.     Mental Status: He is alert and oriented to person, place, and time. Mental status is at baseline.     Cranial Nerves: Cranial nerves 2-12 are intact.     Sensory: Sensation is intact.     Motor: Motor function is intact.     Coordination: Coordination is intact.     Gait: Gait is intact.     Deep Tendon Reflexes: Reflexes are normal and symmetric.  Psychiatric:        Attention and Perception: Attention normal.        Mood and Affect: Mood normal.        Speech: Speech normal.        Behavior: Behavior normal. Behavior is cooperative.        Thought Content: Thought content normal.        Cognition and Memory: Cognition and memory normal.        Judgment: Judgment normal.       Results:   Studies obtained and personally reviewed by me:   Labs:       Component Value Date/Time   NA 139 08/09/2022 0918   K 4.2 08/09/2022 0918   CL 104 08/09/2022 0918   CO2 26 08/09/2022 0918   GLUCOSE 103 (H) 08/09/2022 0918   BUN 15 08/09/2022 0918   CREATININE 0.87 08/09/2022 0918   CALCIUM 9.9 08/09/2022 0918   PROT 6.8  08/09/2022 0918   ALBUMIN 4.4 05/24/2016 1154   AST 21 08/09/2022 0918   ALT 33 08/09/2022 0918   ALKPHOS 102 05/24/2016 1154   BILITOT 0.4 08/09/2022 0918   GFRNONAA 104 09/03/2019 1015   GFRAA 121 09/03/2019 1015     Lab Results  Component Value Date   WBC 6.3 08/09/2022   HGB 15.2 08/09/2022  HCT 44.7 08/09/2022   MCV 83.6 08/09/2022   PLT 317 08/09/2022    Lab Results  Component Value Date   CHOL 182 08/09/2022   HDL 50 08/09/2022   LDLCALC 106 (H) 08/09/2022   TRIG 147 08/09/2022   CHOLHDL 3.6 08/09/2022    Lab Results  Component Value Date   HGBA1C 5.9 (H) 12/21/2021     Lab Results  Component Value Date   TSH 4.31 07/27/2021     Lab Results  Component Value Date   PSA 0.14 07/27/2021      Assessment & Plan:   Hypertension: treated with amlodipine 5 mg daily, hydrochlorothiazide 25 mg daily, losartan 100 mg daily. Blood pressure normal today at 128/82.   Mixed hyperlipidemia: treated with  diet and fenofibrate 160 mg daily. LDL elevated at 106.  Anxiety and insomnia: treated with Xanax at bedtime as needed.Wife has been treated for melanoma. Is doing well now.  Discussed need for screening colonoscopy.He will consider it.  Vaccine counseling: UTD on flu, tetanus vaccines.  Return in 1 year for health maintenance exam or as needed.    I,Alexander Ruley,acting as a Neurosurgeon for Margaree Mackintosh, MD.,have documented all relevant documentation on the behalf of Margaree Mackintosh, MD,as directed by  Margaree Mackintosh, MD while in the presence of Margaree Mackintosh, MD.   I, Margaree Mackintosh, MD, have reviewed all documentation for this visit. The documentation on 08/28/22 for the exam, diagnosis, procedures, and orders are all accurate and complete.

## 2022-08-09 ENCOUNTER — Other Ambulatory Visit: Payer: 59

## 2022-08-09 DIAGNOSIS — I10 Essential (primary) hypertension: Secondary | ICD-10-CM

## 2022-08-09 DIAGNOSIS — E781 Pure hyperglyceridemia: Secondary | ICD-10-CM

## 2022-08-09 LAB — CBC WITH DIFFERENTIAL/PLATELET
Basophils Absolute: 82 cells/uL (ref 0–200)
MCHC: 34 g/dL (ref 32.0–36.0)
Monocytes Relative: 9.4 %
Neutro Abs: 3056 cells/uL (ref 1500–7800)
Neutrophils Relative %: 48.5 %
Platelets: 317 10*3/uL (ref 140–400)
WBC: 6.3 10*3/uL (ref 3.8–10.8)

## 2022-08-10 ENCOUNTER — Encounter: Payer: Self-pay | Admitting: Internal Medicine

## 2022-08-10 ENCOUNTER — Ambulatory Visit (INDEPENDENT_AMBULATORY_CARE_PROVIDER_SITE_OTHER): Payer: 59 | Admitting: Internal Medicine

## 2022-08-10 VITALS — BP 128/82 | HR 72 | Resp 16 | Ht 70.5 in | Wt 254.0 lb

## 2022-08-10 DIAGNOSIS — Z0000 Encounter for general adult medical examination without abnormal findings: Secondary | ICD-10-CM

## 2022-08-10 DIAGNOSIS — I10 Essential (primary) hypertension: Secondary | ICD-10-CM | POA: Diagnosis not present

## 2022-08-10 DIAGNOSIS — Z6835 Body mass index (BMI) 35.0-35.9, adult: Secondary | ICD-10-CM | POA: Diagnosis not present

## 2022-08-10 DIAGNOSIS — E7800 Pure hypercholesterolemia, unspecified: Secondary | ICD-10-CM | POA: Diagnosis not present

## 2022-08-10 DIAGNOSIS — F419 Anxiety disorder, unspecified: Secondary | ICD-10-CM

## 2022-08-10 LAB — CBC WITH DIFFERENTIAL/PLATELET
Absolute Monocytes: 592 cells/uL (ref 200–950)
Basophils Relative: 1.3 %
Eosinophils Absolute: 88 cells/uL (ref 15–500)
Eosinophils Relative: 1.4 %
HCT: 44.7 % (ref 38.5–50.0)
Hemoglobin: 15.2 g/dL (ref 13.2–17.1)
Lymphs Abs: 2482 cells/uL (ref 850–3900)
MCH: 28.4 pg (ref 27.0–33.0)
MCV: 83.6 fL (ref 80.0–100.0)
MPV: 10.7 fL (ref 7.5–12.5)
RBC: 5.35 10*6/uL (ref 4.20–5.80)
RDW: 13.1 % (ref 11.0–15.0)
Total Lymphocyte: 39.4 %

## 2022-08-10 LAB — COMPLETE METABOLIC PANEL WITH GFR
AG Ratio: 1.8 (calc) (ref 1.0–2.5)
ALT: 33 U/L (ref 9–46)
AST: 21 U/L (ref 10–40)
Albumin: 4.4 g/dL (ref 3.6–5.1)
Alkaline phosphatase (APISO): 61 U/L (ref 36–130)
BUN: 15 mg/dL (ref 7–25)
CO2: 26 mmol/L (ref 20–32)
Calcium: 9.9 mg/dL (ref 8.6–10.3)
Chloride: 104 mmol/L (ref 98–110)
Creat: 0.87 mg/dL (ref 0.60–1.29)
Globulin: 2.4 g/dL (calc) (ref 1.9–3.7)
Glucose, Bld: 103 mg/dL — ABNORMAL HIGH (ref 65–99)
Potassium: 4.2 mmol/L (ref 3.5–5.3)
Sodium: 139 mmol/L (ref 135–146)
Total Bilirubin: 0.4 mg/dL (ref 0.2–1.2)
Total Protein: 6.8 g/dL (ref 6.1–8.1)
eGFR: 107 mL/min/{1.73_m2} (ref >=60)

## 2022-08-10 LAB — POCT URINALYSIS DIPSTICK
Bilirubin, UA: NEGATIVE
Blood, UA: NEGATIVE
Glucose, UA: NEGATIVE
Ketones, UA: NEGATIVE
Leukocytes, UA: NEGATIVE
Nitrite, UA: NEGATIVE
Protein, UA: NEGATIVE
Spec Grav, UA: >=1.03 — AB (ref 1.010–1.025)
Urobilinogen, UA: 0.2 E.U./dL — AB
pH, UA: 5 (ref 5.0–8.0)

## 2022-08-10 LAB — LIPID PANEL
Cholesterol: 182 mg/dL (ref <200)
HDL: 50 mg/dL (ref >=40)
LDL Cholesterol (Calc): 106 mg/dL (calc) — ABNORMAL HIGH
Non-HDL Cholesterol (Calc): 132 mg/dL (calc) — ABNORMAL HIGH (ref <130)
Total CHOL/HDL Ratio: 3.6 (calc) (ref <5.0)
Triglycerides: 147 mg/dL (ref <150)

## 2022-08-28 NOTE — Patient Instructions (Addendum)
Hypertension is stable on 3 drug regimen.  Lipids stable on fenofibrate.  Continue to watch fat in diet.  LDL (bad cholesterol) is slightly elevated at 106.  Continue Xanax for anxiety and insomnia as directed.  Discussed screening colonoscopy.  He will consider it.  Up-to-date on influenza and tetanus vaccines.  Return in 1 year or as needed.  It was a pleasure to see you today.

## 2022-09-03 ENCOUNTER — Other Ambulatory Visit: Payer: Self-pay | Admitting: Internal Medicine

## 2022-09-03 DIAGNOSIS — I10 Essential (primary) hypertension: Secondary | ICD-10-CM

## 2022-09-30 ENCOUNTER — Other Ambulatory Visit: Payer: Self-pay | Admitting: Internal Medicine

## 2022-09-30 DIAGNOSIS — I10 Essential (primary) hypertension: Secondary | ICD-10-CM

## 2022-09-30 DIAGNOSIS — Z8639 Personal history of other endocrine, nutritional and metabolic disease: Secondary | ICD-10-CM

## 2022-10-24 ENCOUNTER — Other Ambulatory Visit: Payer: Self-pay | Admitting: Internal Medicine

## 2022-10-24 DIAGNOSIS — G4709 Other insomnia: Secondary | ICD-10-CM

## 2022-10-24 DIAGNOSIS — E781 Pure hyperglyceridemia: Secondary | ICD-10-CM

## 2023-01-10 ENCOUNTER — Other Ambulatory Visit: Payer: Self-pay | Admitting: Family

## 2023-01-10 ENCOUNTER — Other Ambulatory Visit: Payer: Self-pay | Admitting: Internal Medicine

## 2023-01-10 DIAGNOSIS — I10 Essential (primary) hypertension: Secondary | ICD-10-CM

## 2023-01-10 DIAGNOSIS — E781 Pure hyperglyceridemia: Secondary | ICD-10-CM

## 2023-01-10 DIAGNOSIS — Z8639 Personal history of other endocrine, nutritional and metabolic disease: Secondary | ICD-10-CM

## 2023-01-13 ENCOUNTER — Other Ambulatory Visit: Payer: Self-pay

## 2023-01-13 DIAGNOSIS — Z8639 Personal history of other endocrine, nutritional and metabolic disease: Secondary | ICD-10-CM

## 2023-01-13 DIAGNOSIS — I10 Essential (primary) hypertension: Secondary | ICD-10-CM

## 2023-01-13 MED ORDER — POTASSIUM CHLORIDE CRYS ER 20 MEQ PO TBCR: 20.0000 meq | EXTENDED_RELEASE_TABLET | Freq: Every day | ORAL | 1 refills | Status: DC

## 2023-01-13 MED ORDER — LOSARTAN POTASSIUM 100 MG PO TABS: 100.0000 mg | ORAL_TABLET | Freq: Every day | ORAL | 1 refills | Status: DC

## 2023-01-13 MED ORDER — HYDROCHLOROTHIAZIDE 25 MG PO TABS: 25.0000 mg | ORAL_TABLET | Freq: Every day | ORAL | 1 refills | Status: DC

## 2023-01-13 NOTE — Telephone Encounter (Signed)
Copied from CRM [id number]. Topic: Clinical - Medication Refill >> Jan 13, 2023 12:51 PM Tiffany H wrote: Most Recent Primary Care Visit:  Provider: Margaree Mackintosh  Department: [facility]  Visit Type: PHYSICAL 45  Date: 08/10/2022  Medication: potassium chloride SA (KLOR-CON M) 20 MEQ tablet losartan (COZAAR) 100 MG tablet hydrochlorothiazide (HYDRODIURIL) 25 MG tablet  Has the patient contacted their pharmacy? Yes (Agent: If no, request that the patient contact the pharmacy for the refill. If patient does not wish to contact the pharmacy document the reason why and proceed with request.)  (Agent: If yes, when and what did the pharmacy advise?) Request went unanswered.   Is this the correct pharmacy for this prescription? Yes If no, delete pharmacy and type the correct one.  This is the patient's preferred pharmacy:  [facility] # [address] [place] - [address] [place] [address] Phone: [phone] Fax: [phone]   Has the prescription been filled recently? No  Is the patient out of the medication? Yes  Has the patient been seen for an appointment in the last year OR does the patient have an upcoming appointment? Yes  Can we respond through MyChart? Yes  Agent: Please be advised that Rx refills may take up to 3 business days. We ask that you follow-up with your pharmacy.

## 2023-01-18 ENCOUNTER — Telehealth: Payer: Self-pay | Admitting: Internal Medicine

## 2023-01-18 MED ORDER — AMLODIPINE BESYLATE 5 MG PO TABS: 5.0000 mg | ORAL_TABLET | Freq: Every day | ORAL | 0 refills | Status: DC

## 2023-01-18 NOTE — Telephone Encounter (Signed)
Copied from CRM [id number]. Topic: Clinical - Medication Refill >> Jan 18, 2023 10:44 AM Fonda Kinder J wrote: Most Recent Primary Care Visit:  Provider: Margaree Mackintosh  Department: [facility]  Visit Type: PHYSICAL 45  Date: 08/10/2022  Medication: amLODipine  Has the patient contacted their pharmacy? Yes (Agent: If no, request that the patient contact the pharmacy for the refill. If patient does not wish to contact the pharmacy document the reason why and proceed with request.) (Agent: If yes, when and what did the pharmacy advise?) Contact PCP for refill  Is this the correct pharmacy for this prescription? Yes If no, delete pharmacy and type the correct one.  This is the patient's preferred pharmacy:  [facility] # [address] [place] - [address] [place] [address] Phone: [phone] Fax: [phone]   Has the prescription been filled recently? No  Is the patient out of the medication? Yes  Has the patient been seen for an appointment in the last year OR does the patient have an upcoming appointment? Yes  Can we respond through MyChart? Yes  Agent: Please be advised that Rx refills may take up to 3 business days. We ask that you follow-up with your pharmacy.

## 2023-03-03 ENCOUNTER — Other Ambulatory Visit: Payer: Self-pay | Admitting: Internal Medicine

## 2023-03-03 DIAGNOSIS — G4709 Other insomnia: Secondary | ICD-10-CM

## 2023-05-03 ENCOUNTER — Other Ambulatory Visit: Payer: Self-pay | Admitting: Internal Medicine

## 2023-05-27 ENCOUNTER — Other Ambulatory Visit: Payer: Self-pay | Admitting: Internal Medicine

## 2023-05-27 DIAGNOSIS — E781 Pure hyperglyceridemia: Secondary | ICD-10-CM

## 2023-05-30 ENCOUNTER — Other Ambulatory Visit: Payer: Self-pay

## 2023-05-30 DIAGNOSIS — E781 Pure hyperglyceridemia: Secondary | ICD-10-CM

## 2023-05-30 MED ORDER — FENOFIBRATE 160 MG PO TABS: 160.0000 mg | ORAL_TABLET | Freq: Every day | ORAL | 0 refills | Status: DC

## 2023-08-15 ENCOUNTER — Other Ambulatory Visit: Payer: 59

## 2023-08-15 DIAGNOSIS — Z139 Encounter for screening, unspecified: Secondary | ICD-10-CM

## 2023-08-15 DIAGNOSIS — E781 Pure hyperglyceridemia: Secondary | ICD-10-CM

## 2023-08-15 DIAGNOSIS — I10 Essential (primary) hypertension: Secondary | ICD-10-CM | POA: Diagnosis not present

## 2023-08-15 DIAGNOSIS — E7800 Pure hypercholesterolemia, unspecified: Secondary | ICD-10-CM

## 2023-08-15 DIAGNOSIS — Z0000 Encounter for general adult medical examination without abnormal findings: Secondary | ICD-10-CM | POA: Diagnosis not present

## 2023-08-16 ENCOUNTER — Ambulatory Visit: Payer: Self-pay | Admitting: Internal Medicine

## 2023-08-16 LAB — CBC WITH DIFFERENTIAL/PLATELET
Absolute Lymphocytes: 2339 {cells}/uL (ref 850–3900)
Absolute Monocytes: 792 {cells}/uL (ref 200–950)
Basophils Absolute: 82 {cells}/uL (ref 0–200)
Basophils Relative: 0.9 %
Eosinophils Absolute: 200 {cells}/uL (ref 15–500)
Eosinophils Relative: 2.2 %
HCT: 45.4 % (ref 38.5–50.0)
Hemoglobin: 14.7 g/dL (ref 13.2–17.1)
MCH: 27.6 pg (ref 27.0–33.0)
MCHC: 32.4 g/dL (ref 32.0–36.0)
MCV: 85.3 fL (ref 80.0–100.0)
MPV: 10.8 fL (ref 7.5–12.5)
Monocytes Relative: 8.7 %
Neutro Abs: 5688 {cells}/uL (ref 1500–7800)
Neutrophils Relative %: 62.5 %
Platelets: 319 Thousand/uL (ref 140–400)
RBC: 5.32 Million/uL (ref 4.20–5.80)
RDW: 13.4 % (ref 11.0–15.0)
Total Lymphocyte: 25.7 %
WBC: 9.1 Thousand/uL (ref 3.8–10.8)

## 2023-08-16 LAB — COMPLETE METABOLIC PANEL WITHOUT GFR
AG Ratio: 1.9 (calc) (ref 1.0–2.5)
ALT: 95 U/L — ABNORMAL HIGH (ref 9–46)
AST: 62 U/L — ABNORMAL HIGH (ref 10–40)
Albumin: 4.4 g/dL (ref 3.6–5.1)
Alkaline phosphatase (APISO): 87 U/L (ref 36–130)
BUN: 10 mg/dL (ref 7–25)
CO2: 26 mmol/L (ref 20–32)
Calcium: 9.8 mg/dL (ref 8.6–10.3)
Chloride: 104 mmol/L (ref 98–110)
Creat: 0.7 mg/dL (ref 0.60–1.29)
Globulin: 2.3 g/dL (ref 1.9–3.7)
Glucose, Bld: 152 mg/dL — ABNORMAL HIGH (ref 65–99)
Potassium: 4 mmol/L (ref 3.5–5.3)
Sodium: 139 mmol/L (ref 135–146)
Total Bilirubin: 0.6 mg/dL (ref 0.2–1.2)
Total Protein: 6.7 g/dL (ref 6.1–8.1)

## 2023-08-16 LAB — HEMOGLOBIN A1C
Hgb A1c MFr Bld: 8.5 % — ABNORMAL HIGH (ref <5.7)
Mean Plasma Glucose: 197 mg/dL
eAG (mmol/L): 10.9 mmol/L

## 2023-08-16 LAB — LIPID PANEL
Cholesterol: 163 mg/dL (ref <200)
HDL: 44 mg/dL (ref >=40)
LDL Cholesterol (Calc): 95 mg/dL
Non-HDL Cholesterol (Calc): 119 mg/dL (ref <130)
Total CHOL/HDL Ratio: 3.7 (calc) (ref <5.0)
Triglycerides: 140 mg/dL (ref <150)

## 2023-08-16 LAB — MICROALBUMIN / CREATININE URINE RATIO
Creatinine, Urine: 63 mg/dL (ref 20–320)
Microalb Creat Ratio: 5 mg/g{creat} (ref <30)
Microalb, Ur: 0.3 mg/dL

## 2023-08-19 ENCOUNTER — Encounter: Payer: Self-pay | Admitting: Internal Medicine

## 2023-08-19 ENCOUNTER — Ambulatory Visit: Payer: 59 | Admitting: Internal Medicine

## 2023-08-19 VITALS — BP 120/78 | HR 86 | Ht 70.5 in | Wt 267.0 lb

## 2023-08-19 DIAGNOSIS — E1169 Type 2 diabetes mellitus with other specified complication: Secondary | ICD-10-CM

## 2023-08-19 DIAGNOSIS — I10 Essential (primary) hypertension: Secondary | ICD-10-CM | POA: Diagnosis not present

## 2023-08-19 DIAGNOSIS — E785 Hyperlipidemia, unspecified: Secondary | ICD-10-CM | POA: Diagnosis not present

## 2023-08-19 DIAGNOSIS — K760 Fatty (change of) liver, not elsewhere classified: Secondary | ICD-10-CM

## 2023-08-19 DIAGNOSIS — E781 Pure hyperglyceridemia: Secondary | ICD-10-CM

## 2023-08-19 DIAGNOSIS — E876 Hypokalemia: Secondary | ICD-10-CM | POA: Diagnosis not present

## 2023-08-19 DIAGNOSIS — Z0000 Encounter for general adult medical examination without abnormal findings: Secondary | ICD-10-CM | POA: Diagnosis not present

## 2023-08-19 DIAGNOSIS — Z6837 Body mass index (BMI) 37.0-37.9, adult: Secondary | ICD-10-CM

## 2023-08-19 DIAGNOSIS — E119 Type 2 diabetes mellitus without complications: Secondary | ICD-10-CM

## 2023-08-19 DIAGNOSIS — Z1211 Encounter for screening for malignant neoplasm of colon: Secondary | ICD-10-CM

## 2023-08-19 DIAGNOSIS — E669 Obesity, unspecified: Secondary | ICD-10-CM

## 2023-08-19 LAB — POCT URINALYSIS DIP (CLINITEK)
Bilirubin, UA: NEGATIVE
Blood, UA: NEGATIVE
Glucose, UA: NEGATIVE mg/dL
Ketones, POC UA: NEGATIVE mg/dL
Leukocytes, UA: NEGATIVE
Nitrite, UA: NEGATIVE
POC PROTEIN,UA: NEGATIVE
Spec Grav, UA: 1.02 (ref 1.010–1.025)
Urobilinogen, UA: 0.2 U/dL
pH, UA: 6 (ref 5.0–8.0)

## 2023-08-19 MED ORDER — BLOOD GLUCOSE MONITORING SUPPL DEVI
1.0000 | Freq: Three times a day (TID) | 0 refills | Status: AC
Start: 2023-08-19 — End: ?

## 2023-08-19 MED ORDER — LANCETS MISC. MISC: 11 refills | Status: AC

## 2023-08-19 MED ORDER — GLUCOSE BLOOD VI STRP
ORAL_STRIP | 12 refills | Status: AC
Start: 2023-08-19 — End: ?

## 2023-08-19 MED ORDER — SITAGLIPTIN PHOSPHATE 50 MG PO TABS: 50.0000 mg | ORAL_TABLET | Freq: Every day | ORAL | 0 refills | Status: DC

## 2023-08-19 NOTE — Progress Notes (Signed)
 Annual Wellness Visit   Patient Care Team: Saesha Llerenas, Ronal PARAS, MD as PCP - General (Internal Medicine)  Visit Date: 08/19/23   Chief Complaint  Patient presents with   Annual Exam    Patient is agreeable to a colonoscopy, referral to GI has been placed.    Subjective:  Patient: Jesus Roy, Male DOB: [birth year], 48 y.o. MRN: [id number]  Jesus Roy is a 48 y.o. Male who presents today for his Annual Wellness Visit. Patient has Essential Hypertension; Hypertriglyceridemia; History of Smoking; Obesity w/ BMI 32.0-32.9. He is accompanied by his wife today.  He asks about his kidney functions as he's had intermittent flank pain. On review, renal functions WNL.   His wife mentions that he's had some right knee pain, but he says that he hasn't had any since switching his shoes.    History of Hypertension treated with Amlodipine  5 mg daily, Losartan  100 mg daily, and HCTZ 25 mg daily. Blood Pressure: normotensive today at 120/78. History of Hypokalemia, Secondary to Diuretic Therapy, managed on Klor-Con  20 meq daily.   History of Hyperlipidemia treated with Fenofibrate  160 mg daily. 08/16/2023 Lipid Panel: WNL. 2023 Coronary Cardiac Score: 0.  History of Diabetes Mellitus, type II with 08/15/2023 HgbA1c 8.5, elevated significantly from 5.9 in 12/2021; Glucose 152, elevated from 103 in 08/2022.   History of Hepatic Steatosis noted on 2021 Limited Abdominal US  w/ 08/15/2023 AST 62, elevated from 21, and ALT 95, elevated from 33 in 08/2022; Obesity; BMI 35+, today weighs 267 lbsBMI 37.77, which is a ~10 lbs increase from last year when he weighed 254 lbsBMI 35.93. When asked about his usual breakfast, he says that he has reduced his biscuit consumption, however has instead been eating 2 packs Lance Toasty crackers, which he may also eat for lunch occasionally instead of a beef jerky stick.   History of Anxiety/Insomnia treated with Xanax  0.5 mg at bedtime as needed. Also has hx of  mild attention deficit for which he does not take medication.   Labs 08/15/2023 CBC: WNL CMP: Glucose 152; AST 62; ALT 95  Colonoscopy due.  PSA  0.14  07/27/2021.   Vaccine Counseling: UTD on Flu and Tdap.  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 Has 1 step-son aged 68 (in 2025). Former smoker, non-drinker.   Review of Systems  Constitutional:  Negative for chills, fever, malaise/fatigue and weight loss.  HENT:  Negative for hearing loss, sinus pain and sore throat.   Respiratory:  Negative for cough, hemoptysis and shortness of breath.   Cardiovascular:  Negative for chest pain, palpitations, leg swelling and PND.  Gastrointestinal:  Negative for abdominal pain, constipation, diarrhea, heartburn, nausea and vomiting.  Genitourinary:  Negative for dysuria, frequency and urgency.  Musculoskeletal:  Negative for back pain, myalgias and neck pain.  Skin:  Negative for itching and rash.  Neurological:  Negative for dizziness, tingling, seizures and headaches.  Endo/Heme/Allergies:  Negative for polydipsia.  Psychiatric/Behavioral:  Negative for depression. The patient is not nervous/anxious.     Objective:  Vitals: BP 120/78   Pulse 86   Ht 5' 10.5 (1.791 m)   Wt 267 lb (121.1 kg)   SpO2 96%   BMI 37.77 kg/m  Physical Exam Vitals and nursing note reviewed.   Constitutional:      General: He is awake. He is not in acute distress.    Appearance: Normal appearance. He is not ill-appearing or toxic-appearing.  HENT:     Head: Normocephalic and atraumatic.     Right Ear: Tympanic membrane, ear canal and external ear normal.     Left Ear: Tympanic membrane, ear canal and external ear normal.     Mouth/Throat:     Pharynx: Oropharynx is clear.  Eyes:     Extraocular Movements: Extraocular movements intact.     Pupils: Pupils are equal, round, and reactive to light.  Neck:     Thyroid: No thyroid mass, thyromegaly or thyroid tenderness.     Vascular: No carotid bruit.  Cardiovascular:     Rate and Rhythm: Normal rate and regular rhythm. No extrasystoles are present.    Pulses:          Dorsalis pedis pulses are 2+ on the right side and 2+ on the left side.       Posterior tibial pulses are 2+ on the right side and 2+ on the left side.     Heart sounds: Normal heart sounds. No murmur heard.    No friction rub. No gallop.  Pulmonary:     Effort: Pulmonary effort is normal.     Breath sounds: Normal breath sounds. No decreased breath sounds, wheezing, rhonchi or rales.  Chest:     Chest wall: No mass.  Abdominal:     Palpations: Abdomen is soft. There is no hepatomegaly, splenomegaly or mass.     Tenderness: There is no abdominal tenderness.     Hernia: No hernia is present.  Genitourinary:    Comments: Pt declines prostate exam Musculoskeletal:     Cervical back: Normal range of motion.     Right lower leg: Edema (trace) present.     Left lower leg: Edema (1-2+) present.  Lymphadenopathy:     Cervical: No cervical adenopathy.     Upper Body:     Right upper body: No supraclavicular adenopathy.     Left upper body: No supraclavicular adenopathy.  Skin:    General: Skin is warm and dry.  Neurological:     General: No focal deficit present.     Mental Status: He is alert and oriented to person, place, and time. Mental status is at  baseline.     Cranial Nerves: Cranial nerves 2-12 are intact.     Sensory: Sensation is intact.     Motor: Motor function is intact.     Coordination: Coordination is intact.     Gait: Gait is intact.     Deep Tendon Reflexes: Reflexes are normal and symmetric.  Psychiatric:        Attention and Perception: Attention normal.        Mood and Affect: Mood normal.        Speech: Speech normal.        Behavior: Behavior normal. Behavior is cooperative.        Thought Content: Thought content normal.  Cognition and Memory: Cognition and memory normal.        Judgment: Judgment normal.   Most Recent Fall Risk Assessment:    08/10/2022   10:16 AM  Fall Risk   Falls in the past year? 0  Number falls in past yr: 0  Risk for fall due to : No Fall Risks   Most Recent Depression Screenings:    08/19/2023    2:58 PM 08/10/2022   10:16 AM  PHQ 2/9 Scores  PHQ - 2 Score 0 0   Results:  Studies Obtained And Personally Reviewed By Me:  2023 Coronary Cardiac Score: 0.  Labs:     Component Value Date/Time   NA 139 08/15/2023 0910   K 4.0 08/15/2023 0910   CL 104 08/15/2023 0910   CO2 26 08/15/2023 0910   GLUCOSE 152 (H) 08/15/2023 0910   BUN 10 08/15/2023 0910   CREATININE 0.70 08/15/2023 0910   CALCIUM 9.8 08/15/2023 0910   PROT 6.7 08/15/2023 0910   ALBUMIN 4.4 05/24/2016 1154   AST 62 (H) 08/15/2023 0910   ALT 95 (H) 08/15/2023 0910   ALKPHOS 102 05/24/2016 1154   BILITOT 0.6 08/15/2023 0910   GFRNONAA 104 09/03/2019 1015   GFRAA 121 09/03/2019 1015    Lab Results  Component Value Date   WBC 9.1 08/15/2023   HGB 14.7 08/15/2023   HCT 45.4 08/15/2023   MCV 85.3 08/15/2023   PLT 319 08/15/2023   Lab Results  Component Value Date   CHOL 163 08/15/2023   HDL 44 08/15/2023   LDLCALC 95 08/15/2023   TRIG 140 08/15/2023   CHOLHDL 3.7 08/15/2023   Lab Results  Component Value Date   HGBA1C 8.5 (H) 08/15/2023    Lab Results  Component Value Date   TSH 4.31  07/27/2021    Lab Results  Component Value Date   PSA 0.14 07/27/2021     Assessment & Plan:   Orders Placed This Encounter  Procedures   Ambulatory referral to Gastroenterology    Referral Priority:   Routine    Referral Type:   Consultation    Referral Reason:   Specialty Services Required    Number of Visits Requested:   1   Ambulatory referral to Endocrinology    Referral Priority:   Routine    Referral Type:   Consultation    Referral Reason:   Specialty Services Required    Number of Visits Requested:   1   POCT URINALYSIS DIP (CLINITEK)   Meds ordered this encounter  Medications   sitaGLIPtin  (JANUVIA ) 50 MG tablet    Sig: Take 1 tablet (50 mg total) by mouth daily.    Dispense:  30 tablet    Refill:  0   Blood Glucose Monitoring Suppl DEVI    Sig: 1 each by Does not apply route in the morning, at noon, and at bedtime. May substitute to any manufacturer covered by patient's insurance. Check glucose three times a day.    Dispense:  1 each    Refill:  0   Lancets Misc. MISC    Sig: May substitute to any manufacturer covered by patient's insurance. Check glucose three times a day    Dispense:  100 each    Refill:  11   glucose blood test strip    Sig: Use as instructed    Dispense:  100 each    Refill:  12    May substitute to any manufacturer covered by patient's  insurance. Check glucose three times a day  Other Labs Reviewed today: CBC: WNL CMP: Glucose 152; AST 62; ALT 95  Hypertension treated with Amlodipine  5 mg daily, Losartan  100 mg daily, and HCTZ 25 mg daily. Blood Pressure: normotensive today at 120/78.   Hypokalemia, Secondary to Diuretic Therapy, managed on Klor-Con  20 meq daily.   Hyperlipidemia treated with Fenofibrate  160 mg daily. 08/16/2023 Lipid Panel: WNL.   New-onset Diabetes Mellitus, type II with 08/15/2023 HgbA1c 8.5, elevated significantly from 5.9 in 12/2021; Glucose 152, elevated from 103 in 08/2022. Discussed implications of DMII  diagnosis on and methods to improve A1c, including dietary changes, increased exercise, and medication interventions. He is agreeable to starting Januvia  - which has been sent in at 50 mg to take daily, and testing his BGs throughout the day - sending in lancets, glucose test strips, and BGM device. Placing referral for Endocrinology.  Hepatic Steatosis w/ 08/15/2023 AST 62, elevated from 21, and ALT 95, elevated from 33 in 08/2022.  Obesity; BMI 35+, today weighs 267 lbsBMI 37.77, which is a ~10 lbs increase from last year when he weighed 254 lbsBMI 35.93. When asked about his usual breakfast, he says that he has reduced his biscuit consumption, however has instead been eating 2 packs Lance Toasty crackers, which he may also eat for lunch occasionally instead of a beef jerky stick.   Anxiety/Insomnia treated with Xanax  0.5 mg at bedtime as needed.    History of Mild Attention Deficit for which he does not take medication.   Colonoscopy due - discussed and referral placed.   PSA  0.14  07/27/2021.   Vaccine Counseling: UTD on Flu and Tdap.   Annual wellness visit done today including the all of the following: Reviewed patient's Family Medical History Reviewed and updated list of patient's medical providers Assessment of cognitive impairment was done Assessed patient's functional ability Established a written schedule for health screening services Health Risk Assessent Completed and Reviewed  Discussed health benefits of physical activity, and encouraged him to engage in regular exercise appropriate for his age and condition.    I,Emily Lagle,acting as a Neurosurgeon for Ronal JINNY Hailstone, MD.,have documented all relevant documentation on the behalf of Ronal JINNY Hailstone, MD,as directed by  Ronal JINNY Hailstone, MD while in the presence of Ronal JINNY Hailstone, MD.   I, Ronal JINNY Hailstone, MD, have reviewed all documentation for this visit. The documentation on 08/29/23 for the exam, diagnosis, procedures, and orders are  all accurate and complete.

## 2023-08-21 ENCOUNTER — Other Ambulatory Visit: Payer: Self-pay | Admitting: Internal Medicine

## 2023-08-21 DIAGNOSIS — I10 Essential (primary) hypertension: Secondary | ICD-10-CM

## 2023-08-21 DIAGNOSIS — Z8639 Personal history of other endocrine, nutritional and metabolic disease: Secondary | ICD-10-CM

## 2023-08-29 ENCOUNTER — Encounter: Payer: Self-pay | Admitting: Gastroenterology

## 2023-09-13 ENCOUNTER — Other Ambulatory Visit: Payer: Self-pay | Admitting: Internal Medicine

## 2023-10-10 ENCOUNTER — Other Ambulatory Visit: Payer: Self-pay | Admitting: Internal Medicine

## 2023-10-21 ENCOUNTER — Ambulatory Visit (AMBULATORY_SURGERY_CENTER): Payer: Self-pay

## 2023-10-21 ENCOUNTER — Encounter: Payer: Self-pay | Admitting: Gastroenterology

## 2023-10-21 VITALS — Ht 70.5 in | Wt 244.0 lb

## 2023-10-21 DIAGNOSIS — Z1211 Encounter for screening for malignant neoplasm of colon: Secondary | ICD-10-CM

## 2023-10-21 MED ORDER — NA SULFATE-K SULFATE-MG SULF 17.5-3.13-1.6 GM/177ML PO SOLN: 1.0000 | Freq: Once | ORAL | 0 refills | Status: AC

## 2023-10-21 NOTE — Progress Notes (Signed)

## 2023-10-23 ENCOUNTER — Other Ambulatory Visit: Payer: Self-pay | Admitting: Internal Medicine

## 2023-10-23 DIAGNOSIS — G4709 Other insomnia: Secondary | ICD-10-CM

## 2023-10-26 ENCOUNTER — Encounter: Payer: Self-pay | Admitting: Gastroenterology

## 2023-11-04 ENCOUNTER — Encounter: Payer: Self-pay | Admitting: Gastroenterology

## 2023-11-04 ENCOUNTER — Ambulatory Visit (AMBULATORY_SURGERY_CENTER): Payer: Self-pay | Admitting: Gastroenterology

## 2023-11-04 VITALS — BP 117/74 | HR 62 | Temp 97.5°F | Resp 16 | Ht 70.5 in | Wt 244.0 lb

## 2023-11-04 DIAGNOSIS — K5730 Diverticulosis of large intestine without perforation or abscess without bleeding: Secondary | ICD-10-CM

## 2023-11-04 DIAGNOSIS — Z1211 Encounter for screening for malignant neoplasm of colon: Secondary | ICD-10-CM | POA: Diagnosis not present

## 2023-11-04 DIAGNOSIS — K648 Other hemorrhoids: Secondary | ICD-10-CM | POA: Diagnosis not present

## 2023-11-04 MED ORDER — SODIUM CHLORIDE 0.9 % IV SOLN: 500.0000 mL | Freq: Once | INTRAVENOUS | Status: DC

## 2023-11-04 NOTE — Patient Instructions (Signed)
 YOU HAD AN ENDOSCOPIC PROCEDURE TODAY AT THE [facility]:   Refer to the procedure report that was given to you for any specific questions about what was found during the examination.  If the procedure report does not answer your questions, please call your gastroenterologist to clarify.  If you requested that your care partner not be given the details of your procedure findings, then the procedure report has been included in a sealed envelope for you to review at your convenience later.  YOU SHOULD EXPECT: Some feelings of bloating in the abdomen. Passage of more gas than usual.  Walking can help get rid of the air that was put into your GI tract during the procedure and reduce the bloating. If you had a lower endoscopy (such as a colonoscopy or flexible sigmoidoscopy) you may notice spotting of blood in your stool or on the toilet paper. If you underwent a bowel prep for your procedure, you may not have a normal bowel movement for a few days.  Please Note:  You might notice some irritation and congestion in your nose or some drainage.  This is from the oxygen used during your procedure.  There is no need for concern and it should clear up in a day or so.  SYMPTOMS TO REPORT IMMEDIATELY:  Following lower endoscopy (colonoscopy or flexible sigmoidoscopy):  Excessive amounts of blood in the stool  Significant tenderness or worsening of abdominal pains  Swelling of the abdomen that is new, acute  Fever of 100F or higher  Resume previous diet Continue present medications Repeat colonoscopy in 10 years for screening purposes   For urgent or emergent issues, a gastroenterologist can be reached at any hour by calling [phone]. Do not use MyChart messaging for urgent concerns.    DIET:  We do recommend a small meal at first, but then you may proceed to your regular diet.  Drink plenty of fluids but you should avoid alcoholic beverages for 24 hours.  ACTIVITY:  You should plan to  take it easy for the rest of today and you should NOT DRIVE or use heavy machinery until tomorrow (because of the sedation medicines used during the test).    FOLLOW UP: Our staff will call the number listed on your records the next business day following your procedure.  We will call around 7:15- 8:00 am to check on you and address any questions or concerns that you may have regarding the information given to you following your procedure. If we do not reach you, we will leave a message.     If any biopsies were taken you will be contacted by phone or by letter within the next 1-3 weeks.  Please call us  at [phone] if you have not heard about the biopsies in 3 weeks.    SIGNATURES/CONFIDENTIALITY: You and/or your care partner have signed paperwork which will be entered into your electronic medical record.  These signatures attest to the fact that that the information above on your After Visit Summary has been reviewed and is understood.  Full responsibility of the confidentiality of this discharge information lies with you and/or your care-partner.

## 2023-11-04 NOTE — Progress Notes (Signed)
 A/o x 3, VSS, good SR's, pleased with anesthesia, report to RN

## 2023-11-04 NOTE — Op Note (Signed)
 [facility] Patient Name: Jesus Roy Procedure Date: 11/04/2023 8:34 AM MRN: [id number] Endoscopist: Victory L. Legrand , MD, [id number] Age: 48 Referring MD:  Date of Birth: September 27, 1975 Gender: Male Account #: [id number] Procedure:                Colonoscopy Indications:              Screening for colorectal malignant neoplasm, This                            is the patient's first colonoscopy Medicines:                Monitored Anesthesia Care Procedure:                Pre-Anesthesia Assessment:                           - Prior to the procedure, a History and Physical                            was performed, and patient medications and                            allergies were reviewed. The patient's tolerance of                            previous anesthesia was also reviewed. The risks                            and benefits of the procedure and the sedation                            options and risks were discussed with the patient.                            All questions were answered, and informed consent                            was obtained. Prior Anticoagulants: The patient has                            taken no anticoagulant or antiplatelet agents. ASA                            Grade Assessment: II - A patient with mild systemic                            disease. After reviewing the risks and benefits,                            the patient was deemed in satisfactory condition to                            undergo the procedure.  After obtaining informed consent, the colonoscope                            was passed under direct vision. Throughout the                            procedure, the patient's blood pressure, pulse, and                            oxygen saturations were monitored continuously. The                            CF [id number] #[id number] was introduced through the anus                            and advanced to the  the cecum, identified by                            appendiceal orifice and ileocecal valve. The                            colonoscopy was performed without difficulty. The                            patient tolerated the procedure well. The quality                            of the bowel preparation was good. The ileocecal                            valve, appendiceal orifice, and rectum were                            photographed. Scope In: 8:48:45 AM Scope Out: 9:04:02 AM Scope Withdrawal Time: 0 hours 11 minutes 58 seconds  Total Procedure Duration: 0 hours 15 minutes 17 seconds  Findings:                 The perianal and digital rectal examinations were                            normal.                           Repeat examination of right colon under NBI                            performed.                           Internal hemorrhoids were found. The hemorrhoids                            were small.  Multiple diverticula were found in the left colon                            and right colon.                           The exam was otherwise without abnormality on                            direct and retroflexion views. Complications:            No immediate complications. Estimated Blood Loss:     Estimated blood loss: none. Impression:               - Internal hemorrhoids.                           - Diverticulosis in the left colon and in the right                            colon.                           - The examination was otherwise normal on direct                            and retroflexion views.                           - No specimens collected. Recommendation:           - Patient has a contact number available for                            emergencies. The signs and symptoms of potential                            delayed complications were discussed with the                            patient. Return to normal activities tomorrow.                             Written discharge instructions were provided to the                            patient.                           - Resume previous diet.                           - Continue present medications.                           - Repeat colonoscopy in 10 years for screening  purposes. Jesus Roy L. Legrand, MD 11/04/2023 9:08:25 AM This report has been signed electronically.

## 2023-11-04 NOTE — Progress Notes (Signed)
 Pt's states no medical or surgical changes since previsit or office visit.

## 2023-11-04 NOTE — Progress Notes (Signed)
 History and Physical:  This patient presents for endoscopic testing for: Encounter Diagnosis  Name Primary?   Special screening for malignant neoplasms, colon Yes    Average risk for colorectal cancer.  1st screening exam.  Patient denies chronic abdominal pain, rectal bleeding, constipation or diarrhea.   Patient is otherwise without complaints or active issues today.   Past Medical History: Past Medical History:  Diagnosis Date   Diabetes mellitus without complication (HCC)    Hypertension      Past Surgical History: Past Surgical History:  Procedure Laterality Date   WISDOM TOOTH EXTRACTION     WRIST SURGERY      Allergies: Allergies  Allergen Reactions   Penicillins Itching    Outpatient Meds: Current Outpatient Medications  Medication Sig Dispense Refill   ALPRAZolam  (XANAX ) 0.5 MG tablet TAKE ONE TABLET BY MOUTH DAILY AT BEDTIME AS NEEDED FOR ANXIETY 90 tablet 1   amLODipine  (NORVASC ) 5 MG tablet Take 1 tablet (5 mg total) by mouth daily. 90 tablet 1   Blood Glucose Monitoring Suppl DEVI 1 each by Does not apply route in the morning, at noon, and at bedtime. May substitute to any manufacturer covered by patient's insurance. Check glucose three times a day. 1 each 0   fenofibrate  160 MG tablet TAKE ONE TABLET BY MOUTH ONCE A DAY 90 tablet 0   fenofibrate  160 MG tablet Take 1 tablet (160 mg total) by mouth daily. 90 tablet 0   glucose blood test strip Use as instructed 100 each 12   hydrochlorothiazide  (HYDRODIURIL ) 25 MG tablet Take 1 tablet (25 mg total) by mouth daily. 90 tablet 0   Lancets Misc. MISC May substitute to any manufacturer covered by patient's insurance. Check glucose three times a day 100 each 11   losartan  (COZAAR ) 100 MG tablet Take 1 tablet (100 mg total) by mouth daily. 90 tablet 1   potassium chloride  SA (KLOR-CON  M) 20 MEQ tablet Take 1 tablet (20 mEq total) by mouth daily. 90 tablet 0   sitaGLIPtin  (JANUVIA ) 50 MG tablet TAKE ONE TABLET BY  MOUTH ONCE A DAY 90 tablet 1   Current Facility-Administered Medications  Medication Dose Route Frequency Provider Last Rate Last Admin   0.9 %  sodium chloride infusion  500 mL Intravenous Once Danis, Lisaann Atha L III, MD          ___________________________________________________________________ Objective   Exam:  BP 131/86   Pulse 66   Temp (!) 97.5 F (36.4 C)   Ht 5' 10.5 (1.791 m)   Wt 244 lb (110.7 kg)   SpO2 97%   BMI 34.52 kg/m   CV: regular , S1/S2 Resp: clear to auscultation bilaterally, normal RR and effort noted GI: soft, no tenderness, with active bowel sounds.   Assessment: Encounter Diagnosis  Name Primary?   Special screening for malignant neoplasms, colon Yes     Plan: Colonoscopy   The benefits and risks of the planned procedure(s) were described in detail with the patient or (when appropriate) their health care proxy.  Risks were outlined as including, but not limited to, bleeding, infection, perforation, adverse medication reaction leading to cardiac or pulmonary decompensation, pancreatitis (if ERCP).  The limitation of incomplete mucosal visualization was also discussed.  No guarantees or warranties were given.  The patient is appropriate for an endoscopic procedure in the ambulatory setting.   - Victory Brand, MD

## 2023-11-07 ENCOUNTER — Telehealth: Payer: Self-pay

## 2023-11-07 NOTE — Telephone Encounter (Signed)
  Follow up Call-     11/04/2023    8:14 AM  Call back number  Post procedure Call Back phone  # [phone]  Permission to leave phone message Yes     Patient questions:  Do you have a fever, pain , or abdominal swelling? No. Pain Score  0 *  Have you tolerated food without any problems? Yes.    Have you been able to return to your normal activities? Yes.    Do you have any questions about your discharge instructions: Diet   No. Medications  No. Follow up visit  No.  Do you have questions or concerns about your Care? No.  Actions: * If pain score is 4 or above: No action needed, pain <4.

## 2023-11-19 ENCOUNTER — Other Ambulatory Visit: Payer: Self-pay | Admitting: Internal Medicine

## 2023-11-19 DIAGNOSIS — Z8639 Personal history of other endocrine, nutritional and metabolic disease: Secondary | ICD-10-CM

## 2023-11-19 DIAGNOSIS — I10 Essential (primary) hypertension: Secondary | ICD-10-CM

## 2023-11-22 ENCOUNTER — Encounter: Payer: Self-pay | Admitting: Internal Medicine

## 2023-11-22 ENCOUNTER — Ambulatory Visit: Admitting: Internal Medicine

## 2023-11-22 ENCOUNTER — Ambulatory Visit: Payer: Self-pay

## 2023-11-22 VITALS — BP 120/80 | HR 78 | Temp 98.3°F | Ht 70.5 in

## 2023-11-22 DIAGNOSIS — R7302 Impaired glucose tolerance (oral): Secondary | ICD-10-CM

## 2023-11-22 DIAGNOSIS — Z6834 Body mass index (BMI) 34.0-34.9, adult: Secondary | ICD-10-CM | POA: Diagnosis not present

## 2023-11-22 DIAGNOSIS — R319 Hematuria, unspecified: Secondary | ICD-10-CM

## 2023-11-22 DIAGNOSIS — E1169 Type 2 diabetes mellitus with other specified complication: Secondary | ICD-10-CM | POA: Diagnosis not present

## 2023-11-22 DIAGNOSIS — R109 Unspecified abdominal pain: Secondary | ICD-10-CM

## 2023-11-22 LAB — POCT URINALYSIS DIP (CLINITEK)
Bilirubin, UA: NEGATIVE
Glucose, UA: NEGATIVE mg/dL
Ketones, POC UA: NEGATIVE mg/dL
Leukocytes, UA: NEGATIVE
Nitrite, UA: NEGATIVE
POC PROTEIN,UA: NEGATIVE
Spec Grav, UA: 1.015 (ref 1.010–1.025)
Urobilinogen, UA: 0.2 U/dL
pH, UA: 6 (ref 5.0–8.0)

## 2023-11-22 MED ORDER — HYDROCODONE-ACETAMINOPHEN 10-325 MG PO TABS: ORAL_TABLET | ORAL | 0 refills | Status: DC

## 2023-11-22 NOTE — Telephone Encounter (Signed)
 FYI Only or Action Required?: FYI only for provider.  Patient was last seen in primary care on 08/19/2023 by Perri Ronal PARAS, MD.  Called Nurse Triage reporting Abdominal Pain.  Symptoms began ongoing for some time and worsening.  Interventions attempted: OTC medications: pain medication.  Symptoms are: usually pain comes and goes and today pain is constant.  Triage Disposition: See HCP Within 4 Hours (Or PCP Triage)  Patient/caregiver understands and will follow disposition?: Yes   Copied from CRM #[id number]. Topic: Clinical - Red Word Triage >> Nov 22, 2023 10:58 AM Winona R wrote: Pain on eft side of stomach, pt can also feel a knot Reason for Disposition  [1] MILD-MODERATE pain AND [2] constant AND [3] present > 2 hours  Answer Assessment - Initial Assessment Questions 1. LOCATION: Where does it hurt?      Abd pain to left of navel also a knot there refers to left back 2. RADIATION: Does the pain shoot anywhere else? (e.g., chest, back)     Left back 3. ONSET: When did the pain begin? (Minutes, hours or days ago)      Ongoing for a while 4. SUDDEN: Gradual or sudden onset?     na 5. PATTERN Does the pain come and go, or is it constant?     Comes and goes 6. SEVERITY: How bad is the pain?  (e.g., Scale 1-10; mild, moderate, or severe)     4/10 7. RECURRENT SYMPTOM: Have you ever had this type of stomach pain before? If Yes, ask: When was the last time? and What happened that time?      yes 8. CAUSE: What do you think is causing the stomach pain? (e.g., gallstones, recent abdominal surgery)     no 9. RELIEVING/AGGRAVATING FACTORS: What makes it better or worse? (e.g., antacids, bending or twisting motion, bowel movement)     no 10. OTHER SYMPTOMS: Do you have any other symptoms? (e.g., back pain, diarrhea, fever, urination pain, vomiting)       no  When push on abd area makes pain decrease  Protocols used: Abdominal Pain - Male-A-AH

## 2023-11-22 NOTE — Progress Notes (Signed)
 Patient Care Team: Perri Ronal PARAS, MD as PCP - General (Internal Medicine)  Visit Date: 11/22/23  Subjective:    Patient ID: Jesus Roy , Male   DOB: Dec 09, 1975, 48 y.o.    MRN: [id number]   48 y.o. Male presents today for Abdominal pain. Patient has a past medical history of Hypertension, Hyperlipidemia, Hepatic stenosis.   He said that he has Left sided abdominal pain that wraps around to his back. He also mentions that he has a knot in abdomen and that he has had it for many years. The pain is on and off but it most recently happened yesterday when he was waiting for his wife at the doctors office. He said that Advil or Aleve will relieve the pain for a while but it comes back. He also mentions that sometimes his urine is dark like the color of Dr. Nunzio. He said that when he takes Advil or Aleve that it makes him constipated and after his most recent bowel movement when he wiped there was blood. Urinalysis today showed his blood was orange and cloudy and there was large occult blood.    History of Diabetes Mellitus, type II treated with Januvia  50 mg daily.  08/15/2023 HgbA1c 8.5.   11/04/2023 Colonoscopy Internal hemorrhoids. Diverticulosis in the left colon and in the right colon. The examination was otherwise normal on direct and retroflexion views. No specimens collected.  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 He is married - wife works in Teacher, music. Has 1 step-son aged 51 (in 2025). Former smoker, non-drinker.      Review of Systems  Gastrointestinal:  Positive for abdominal pain.         Objective:   Vitals: BP 120/80   Pulse 78   Temp 98.3 F (36.8 C)   Ht 5' 10.5 (1.791 m)   SpO2 98%   BMI 34.52 kg/m    Physical Exam Exam conducted with a chaperone present (Araceli Hartford, CMA).  Abdominal:     General: There is no distension.     Palpations: Abdomen is soft.     Tenderness: There is abdominal tenderness in the left lower quadrant.     Comments: Mild tenderness in left lower abdomen without rebound        Results:     Labs:       Component Value Date/Time   NA 139 08/15/2023 0910   K 4.0 08/15/2023 0910   CL 104 08/15/2023 0910   CO2 26 08/15/2023 0910   GLUCOSE 152 (H) 08/15/2023 0910   BUN 10 08/15/2023 0910   CREATININE 0.70 08/15/2023 0910   CALCIUM 9.8 08/15/2023 0910   PROT 6.7 08/15/2023 0910   ALBUMIN 4.4 05/24/2016 1154   AST 62 (H) 08/15/2023 0910   ALT 95 (H) 08/15/2023 0910   ALKPHOS 102 05/24/2016 1154   BILITOT 0.6 08/15/2023 0910   GFRNONAA 104 09/03/2019 1015   GFRAA 121 09/03/2019 1015     Lab Results  Component Value Date  WBC 9.1 08/15/2023   HGB 14.7 08/15/2023   HCT 45.4 08/15/2023   MCV 85.3 08/15/2023   PLT 319 08/15/2023    Lab Results  Component Value Date   CHOL 163 08/15/2023   HDL 44 08/15/2023   LDLCALC 95 08/15/2023   TRIG 140 08/15/2023   CHOLHDL 3.7 08/15/2023    Lab Results  Component Value Date   HGBA1C 8.5 (H) 08/15/2023     Lab Results  Component Value Date   TSH 4.31 07/27/2021     Lab Results  Component Value Date   PSA 0.14 07/27/2021     Assessment & Plan:   Orders Placed This Encounter  Procedures   CT ABDOMEN PELVIS W CONTRAST    Left cva tenderness radiating into left groin. Large blood in ua, suspect kidney stone. Nausea no vomiting. On set yesterday.  No fever or chills. No dysuria, urine is orange color.    Standing Status:   Future    Expected Date:   11/22/2023    Expiration Date:   11/21/2024    If indicated for the ordered procedure, I authorize the  administration of contrast media per Radiology protocol:   Yes    Does the patient have a contrast media/X-ray dye allergy?:   No    Preferred imaging location?:   [facility]    If indicated for the ordered procedure, I authorize the administration of oral contrast media per Radiology protocol:   Yes   CBC with Differential/Platelet   Comprehensive metabolic panel with GFR   Gamma GT   Ambulatory referral to Endocrinology    Referral Priority:   Routine    Referral Type:   Consultation    Referral Reason:   Specialty Services Required    Referred to Provider:   Trixie File, MD    Number of Visits Requested:   1   POCT URINALYSIS DIP (CLINITEK)   Meds ordered this encounter  Medications   HYDROcodone -acetaminophen (NORCO) 10-325 MG tablet    Sig: One tab by mouth every 6-8 hours as needed for abdominal pain    Dispense:  15 tablet    Refill:  0    Abdominal Pain, Unspecified area: He said that he has Left sided abdominal pain that wraps around to his back. He also mentions that he has a knot in abdomen and that he has had it for many years. The pain is on and off but it most recently happened yesterday when he was waiting for his wife at the doctors office. He said that Advil or Aleve will relieve the pain for a while but it comes back. He also mentions that sometimes his urine is dark like the color of Dr. Nunzio. He said that when he takes Advil or Aleve that it makes him constipated and after his most recent bowel movement when he wiped there was blood. Urinalysis today showed his blood was orange and cloudy and there was large occult blood.    CT abdomen with contrast ordered because of large occult blood in urine and suspected kidney stones.     Norco 10-325 mg half to one tablet every 8 hours for pain.    Diabetes Mellitus, type II:  Treated with Januvia  50 mg daily.  08/15/2023 HgbA1c 8.5.    Referred to endocrinology   11/04/2023 Colonoscopy Internal hemorrhoids.  Diverticulosis in the left colon and in the right colon. The examination was otherwise normal on direct and retroflexion views. No specimens collected.  I,Makayla C Reid,acting as a scribe for Ronal JINNY Hailstone, MD.,have documented all relevant documentation on the behalf of Ronal JINNY Hailstone, MD,as directed by  Ronal JINNY Hailstone, MD while in the presence of Ronal JINNY Hailstone, MD.

## 2023-11-23 ENCOUNTER — Ambulatory Visit
Admission: RE | Admit: 2023-11-23 | Discharge: 2023-11-23 | Disposition: A | Discharge status: Home / Self Care | Source: Ambulatory Visit | Attending: Internal Medicine | Admitting: Internal Medicine

## 2023-11-23 ENCOUNTER — Other Ambulatory Visit

## 2023-11-23 DIAGNOSIS — R109 Unspecified abdominal pain: Secondary | ICD-10-CM

## 2023-11-23 DIAGNOSIS — K760 Fatty (change of) liver, not elsewhere classified: Secondary | ICD-10-CM | POA: Diagnosis not present

## 2023-11-23 DIAGNOSIS — K5730 Diverticulosis of large intestine without perforation or abscess without bleeding: Secondary | ICD-10-CM | POA: Diagnosis not present

## 2023-11-23 LAB — CBC WITH DIFFERENTIAL/PLATELET
Absolute Lymphocytes: 1789 {cells}/uL (ref 850–3900)
Absolute Monocytes: 589 {cells}/uL (ref 200–950)
Basophils Absolute: 57 {cells}/uL (ref 0–200)
Basophils Relative: 0.8 %
Eosinophils Absolute: 57 {cells}/uL (ref 15–500)
Eosinophils Relative: 0.8 %
HCT: 47.2 % (ref 38.5–50.0)
Hemoglobin: 15.5 g/dL (ref 13.2–17.1)
MCH: 27.8 pg (ref 27.0–33.0)
MCHC: 32.8 g/dL (ref 32.0–36.0)
MCV: 84.7 fL (ref 80.0–100.0)
MPV: 11.5 fL (ref 7.5–12.5)
Monocytes Relative: 8.3 %
Neutro Abs: 4608 {cells}/uL (ref 1500–7800)
Neutrophils Relative %: 64.9 %
Platelets: 316 Thousand/uL (ref 140–400)
RBC: 5.57 Million/uL (ref 4.20–5.80)
RDW: 13 % (ref 11.0–15.0)
Total Lymphocyte: 25.2 %
WBC: 7.1 Thousand/uL (ref 3.8–10.8)

## 2023-11-23 LAB — COMPREHENSIVE METABOLIC PANEL WITH GFR
AG Ratio: 1.8 (calc) (ref 1.0–2.5)
ALT: 35 U/L (ref 9–46)
AST: 28 U/L (ref 10–40)
Albumin: 4.6 g/dL (ref 3.6–5.1)
Alkaline phosphatase (APISO): 61 U/L (ref 36–130)
BUN: 16 mg/dL (ref 7–25)
CO2: 29 mmol/L (ref 20–32)
Calcium: 10 mg/dL (ref 8.6–10.3)
Chloride: 101 mmol/L (ref 98–110)
Creat: 1 mg/dL (ref 0.60–1.29)
Globulin: 2.5 g/dL (ref 1.9–3.7)
Glucose, Bld: 99 mg/dL (ref 65–99)
Potassium: 4.2 mmol/L (ref 3.5–5.3)
Sodium: 138 mmol/L (ref 135–146)
Total Bilirubin: 0.4 mg/dL (ref 0.2–1.2)
Total Protein: 7.1 g/dL (ref 6.1–8.1)
eGFR: 93 mL/min/1.73m2 (ref >=60)

## 2023-11-23 LAB — URINE CULTURE
MICRO NUMBER:: 17127485
Result:: NO GROWTH
SPECIMEN QUALITY:: ADEQUATE

## 2023-11-23 LAB — GAMMA GT: GGT: 22 U/L (ref 3–95)

## 2023-11-24 ENCOUNTER — Ambulatory Visit: Payer: Self-pay | Admitting: Internal Medicine

## 2023-11-25 ENCOUNTER — Encounter: Payer: Self-pay | Admitting: Internal Medicine

## 2023-11-25 ENCOUNTER — Telehealth: Payer: Self-pay | Admitting: Internal Medicine

## 2023-11-25 ENCOUNTER — Ambulatory Visit: Payer: Self-pay

## 2023-11-25 DIAGNOSIS — N201 Calculus of ureter: Secondary | ICD-10-CM

## 2023-11-25 NOTE — Patient Instructions (Addendum)
 Sending him for CT of abdomen and pelvis due to LLQ abdominal pain. May have kidney stones. Consider acute diverticulitis. Also referring him to Endocrinology for consideration of GLP medication due to Diabetes mellitus and obesity. Rest and stay well hydrated. Take pain med sparingly. Call if symptoms worsen.

## 2023-11-25 NOTE — Telephone Encounter (Signed)
 Received report from Radiology late afternoon. He has a 5 mm kidney stone partially blocking left ureter 2 cm distal to UPJ. Patient was contacted. He is going out of town week of November 3. We will try to have him seen at [facility] next week. He is to drink lots of water and avoid ice tea/sodas. He currently is not having pain, fever or chills. There is also concern for distal urethral stricture or urothelial lesion. Will make urgent referral to [facility]. He is to call if having significant dysuria, fever or chills.  MJB, MD

## 2023-11-25 NOTE — Telephone Encounter (Signed)
 Have asked that CT of abdomen and pelvis performed October 22  be read as soon as possible. I was concerned about a mass or kidney stones when I saw him on October 21. He is not worse but would like to give him the report.  MJB, MD

## 2023-11-25 NOTE — Telephone Encounter (Signed)
 FYI Only or Action Required?: FYI only for provider.  Patient was last seen in primary care on 11/22/2023 by Perri Ronal PARAS, MD.  Called Nurse Triage reporting Results.   Triage Disposition: Call PCP Now  Patient/caregiver understands and will follow disposition?: Yes      Copied from CRM [id number]. Topic: Clinical - Lab/Test Results >> Nov 25, 2023  5:53 PM Delon DASEN wrote: Reason for CRM: M.J. with [facility]- critical findings     Reason for Disposition  Lab or radiology calling with CRITICAL test results  Answer Assessment - Initial Assessment Questions Attempted to call Dr. Perri for results but no answer. Routing high priority to clinic.    1. REASON FOR CALL or QUESTION: What is your reason for calling today? or How can I best     Radiology results  2. CALLER: Document the source of call. (e.g., laboratory staff, caregiver or patient).     MJ from [facility]    IMPRESSION: 1. Mild left hydronephrosis and proximal hydroureter, with 5 mm stone in the proximal left ureter about 2 cm distal to the UPJ. The ureter distal to the stone is also slightly dilated and demonstrates periureteral stranding down to the level of the pelvis where there is a caliber change of the ureter about a cm proximal to the left UVJ. Distal ureteral stricture or urothelial lesion not excluded. 2. 10 mm stone lower pole left kidney. 3. Hepatic steatosis. 4. Diverticular disease of the left colon without acute inflammatory process. 5. Aortic atherosclerosis.  Protocols used: PCP Call - No Triage-A-AH

## 2023-11-29 DIAGNOSIS — N201 Calculus of ureter: Secondary | ICD-10-CM | POA: Diagnosis not present

## 2023-11-30 ENCOUNTER — Other Ambulatory Visit: Payer: Self-pay | Admitting: Urology

## 2023-12-01 ENCOUNTER — Encounter: Payer: Self-pay | Admitting: Internal Medicine

## 2023-12-01 ENCOUNTER — Ambulatory Visit: Admitting: Internal Medicine

## 2023-12-01 ENCOUNTER — Ambulatory Visit: Payer: Self-pay | Admitting: Internal Medicine

## 2023-12-01 DIAGNOSIS — Z23 Encounter for immunization: Secondary | ICD-10-CM

## 2023-12-01 DIAGNOSIS — L0233 Carbuncle of buttock: Secondary | ICD-10-CM

## 2023-12-01 LAB — CBC WITH DIFFERENTIAL/PLATELET
Absolute Lymphocytes: 2594 {cells}/uL (ref 850–3900)
Absolute Monocytes: 979 {cells}/uL — ABNORMAL HIGH (ref 200–950)
Basophils Absolute: 76 {cells}/uL (ref 0–200)
Basophils Relative: 0.8 %
Eosinophils Absolute: 105 {cells}/uL (ref 15–500)
Eosinophils Relative: 1.1 %
HCT: 44.9 % (ref 38.5–50.0)
Hemoglobin: 15.2 g/dL (ref 13.2–17.1)
MCH: 27.8 pg (ref 27.0–33.0)
MCHC: 33.9 g/dL (ref 32.0–36.0)
MCV: 82.1 fL (ref 80.0–100.0)
MPV: 11.1 fL (ref 7.5–12.5)
Monocytes Relative: 10.3 %
Neutro Abs: 5748 {cells}/uL (ref 1500–7800)
Neutrophils Relative %: 60.5 %
Platelets: 363 Thousand/uL (ref 140–400)
RBC: 5.47 Million/uL (ref 4.20–5.80)
RDW: 13.1 % (ref 11.0–15.0)
Total Lymphocyte: 27.3 %
WBC: 9.5 Thousand/uL (ref 3.8–10.8)

## 2023-12-01 MED ORDER — CEFTRIAXONE SODIUM 1 G IJ SOLR
1.0000 g | Freq: Once | INTRAMUSCULAR | Status: DC
  Administered 2023-12-01: 1 g via INTRAMUSCULAR

## 2023-12-01 MED ORDER — DOXYCYCLINE HYCLATE 100 MG PO TABS: 100.0000 mg | ORAL_TABLET | Freq: Two times a day (BID) | ORAL | 0 refills | Status: DC

## 2023-12-01 NOTE — Progress Notes (Addendum)
 Patient Care Team: Perri Ronal PARAS, MD as PCP - General (Internal Medicine)  Visit Date: 12/01/23  Subjective:    Patient ID: Jesus Roy , Male   DOB: [birth year], 48 y.o.    MRN: [id number]   48 y.o. Male presents today for skin problems. Patient has a past medical history of Hypertension, Type 2 Diabetes,  Hypertriglyceridemia, and  Hepatic stenosis.  He said he has a lesion on his left buttock that has  purulent discharge. He has a fishing trip planned for next week at the beach.  Scheduled for lithotripsy with Dr. Norleen Seltzer at [facility] November 24. Recently found to have 5 mm stone proximal left ureter about 2 cm distal to UPJ. Ureter distal to the stone is also slightly dilated with periureteral stranding down to the level of the pelvis where there is a caliber change of the ureter about a cm proximal to the left UVJ, Distal urothelial lesion ot stricture cannot be excluded.   Past Medical History:  Diagnosis Date   Diabetes mellitus without complication (HCC)    Hypertension      Family History  Problem Relation Age of Onset   Hypertension Father    Hyperlipidemia Father    Colon cancer Neg Hx    Rectal cancer Neg Hx    Stomach cancer Neg Hx    Esophageal cancer Neg Hx     Social History   Social History Narrative   Works for an neurosurgeon company in airline pilot. He is married - wife works in teacher, music. Has 1 step-son aged 21 (in 2025). Former smoker, non-drinker.      Review of Systems  Constitutional:  Negative for chills and fever.        Objective:   Vitals: Pulse 78   Ht 5' 10.5 (1.791 m)   Wt 246 lb (111.6 kg)   SpO2 98%   BMI 34.80 kg/m    Physical Exam Skin:    Comments: Carbuncle on Left lower inner buttock, Centimeter in diameter with a central opening and discharge.        Results:    Labs:       Component Value Date/Time   NA 138 11/22/2023 1308   K 4.2 11/22/2023 1308   CL 101 11/22/2023 1308   CO2 29  11/22/2023 1308   GLUCOSE 99 11/22/2023 1308   BUN 16 11/22/2023 1308   CREATININE 1.00 11/22/2023 1308   CALCIUM 10.0 11/22/2023 1308   PROT 7.1 11/22/2023 1308   ALBUMIN 4.4 05/24/2016 1154   AST 28 11/22/2023 1308   ALT 35 11/22/2023 1308   ALKPHOS 102 05/24/2016 1154   BILITOT 0.4 11/22/2023 1308   GFRNONAA 104 09/03/2019 1015   GFRAA 121 09/03/2019 1015     Lab Results  Component Value Date   WBC 7.1 11/22/2023   HGB 15.5 11/22/2023   HCT 47.2 11/22/2023   MCV 84.7 11/22/2023   PLT 316 11/22/2023    Lab Results  Component Value Date   CHOL 163 08/15/2023   HDL 44 08/15/2023   LDLCALC 95 08/15/2023   TRIG 140 08/15/2023   CHOLHDL 3.7 08/15/2023    Lab Results  Component Value Date   HGBA1C 8.5 (H) 08/15/2023     Lab Results  Component Value Date   TSH 4.31 07/27/2021     Lab Results  Component Value Date   PSA 0.14 07/27/2021     Assessment & Plan:   Orders Placed This Encounter  Procedures   Tdap vaccine greater than or equal to 7yo IM   CBC with Differential/Platelet   Meds ordered this encounter  Medications   DISCONTD: cefTRIAXone (ROCEPHIN) injection 1 g   doxycycline  (VIBRA -TABS) 100 MG tablet    Sig: Take 1 tablet (100 mg total) by mouth 2 (two) times daily.    Dispense:  20 tablet    Refill:  0     Carbuncle of Buttock: He said he has a draining lesion left buttock on his left buttock. Purulent drainage noted by patient. He has not had ay lesions of this type previously. His wife has been squeezing it to release the discharge. He has been told to stop doing this.   Today received in the office:  Rocephin 1 g IM injection received.     Prescribed Doxycyline 100 mg twice daily by mouth for seven days.   Sitz bath warm soapy water 1-2 times a day for 30 minutes CBC ordered.  WBC 9500. Hgb15.2 grams.  Follow up in one week or sooner if worse, if not improving may need I and D by General Surgeon.  I,Makayla C Reid,acting as a scribe for  Ronal JINNY Hailstone, MD.,have documented all relevant documentation on the behalf of Ronal JINNY Hailstone, MD,as directed by  Ronal JINNY Hailstone, MD while in the presence of Ronal JINNY Hailstone, MD.   I, Ronal JINNY Hailstone, MD, have reviewed all documentation for this visit. The documentation on 12/01/2023 for the exam, diagnosis, procedures, and orders are all accurate and complete.

## 2023-12-02 ENCOUNTER — Encounter: Payer: Self-pay | Admitting: Internal Medicine

## 2023-12-02 NOTE — Patient Instructions (Addendum)
 You have been diagnosed with carbuncle of left buttock.  1 g IM Rocephin given in office today.  Doxycycline  100 mg twice daily by mouth for 7 days prescribed.  Take sitz bath's and warm soapy water 1-2 times daily for 30 minutes.  White blood cell count 9500.  Follow-up in 1 week or sooner if worse.  If not improving may need incision and drainage by general surgeon.  Has upcoming lithotripsy planned at [facility].

## 2023-12-14 ENCOUNTER — Ambulatory Visit: Payer: Self-pay | Admitting: Endocrinology

## 2023-12-14 ENCOUNTER — Encounter: Payer: Self-pay | Admitting: Endocrinology

## 2023-12-14 VITALS — BP 104/72 | HR 59 | Resp 16 | Ht 70.5 in | Wt 249.8 lb

## 2023-12-14 DIAGNOSIS — E119 Type 2 diabetes mellitus without complications: Secondary | ICD-10-CM | POA: Diagnosis not present

## 2023-12-14 DIAGNOSIS — E785 Hyperlipidemia, unspecified: Secondary | ICD-10-CM | POA: Diagnosis not present

## 2023-12-14 DIAGNOSIS — Z7985 Long-term (current) use of injectable non-insulin antidiabetic drugs: Secondary | ICD-10-CM

## 2023-12-14 LAB — POCT GLYCOSYLATED HEMOGLOBIN (HGB A1C): Hemoglobin A1C: 5.7 % — AB (ref 4.0–5.6)

## 2023-12-14 MED ORDER — TIRZEPATIDE 5 MG/0.5ML ~~LOC~~ SOAJ: 5.0000 mg | SUBCUTANEOUS | 4 refills | Status: DC

## 2023-12-14 MED ORDER — TIRZEPATIDE 2.5 MG/0.5ML ~~LOC~~ SOAJ: 2.5000 mg | SUBCUTANEOUS | 0 refills | Status: AC

## 2023-12-14 NOTE — Progress Notes (Addendum)
 Outpatient Endocrinology Note Joslynne Klatt, MD   Patient's Name: Jesus Roy    DOB: 1975-03-09    MRN: [id number]                                                    REASON OF VISIT: New consult for type 2 diabetes mellitus  REFERRING PROVIDER: Perri Ronal PARAS, MD  PCP: Perri Ronal PARAS, MD  HISTORY OF PRESENT ILLNESS:   Jesus Roy is a 48 y.o. old male with past medical history listed below, is here for new consult for type 2 diabetes mellitus.   Pertinent Diabetes History: Patient is referred to endocrinology for the newly diagnosed type 2 diabetes mellitus, initial consult on December 14, 2023.  Patient was diagnosed with type 2 diabetes mellitus with hemoglobin A1c of 8.5% in July 2025.  He had prediabetes for few years prior to the diagnosis of type 2 diabetes mellitus.  He was restarted on Januvia  50 mg daily in July 2025 after the diagnosis of diabetes mellitus.  History of DKA or diabetes related hospitalizations: none  Previous diabetes education: none  Family h/o diabetes mellitus: aunt has type 2 diabetes mellitus.    No personal history of pancreatitis and / or family history of medullary thyroid carcinoma or MEN 2B syndrome.   Chronic Diabetes Complications : Retinopathy: unknown. Last ophthalmology exam was done on Due, following with ophthalmology regularly.  Nephropathy: no, on ACE/ARB /losartan . Peripheral neuropathy: no Coronary artery disease: no Stroke: no  Relevant comorbidities and cardiovascular risk factors: Obesity: yes Body mass index is 35.34 kg/m.  Hypertension: Yes  Hyperlipidemia : Yes, not on statin   Current / Home Diabetic regimen includes:  Januvia  50 mg daily.  Prior diabetic medications: None  Glycemic data:   He has been checking blood sugar 2-3 times a day, in the morning fasting, in the afternoon and evening.  Glucose data from his phone app reviewed.  Average blood sugar in 90 days 112 and in last 30 days  116.  Hypoglycemia: Patient has no hypoglycemic episodes. Patient has hypoglycemia awareness.  Factors modifying glucose control: 1.  Diabetic diet assessment: 3 meals a day. Stopped soda and sweet tea.  Occasionally drinking diet soda.  Reduced crackers and breads.  2.  Staying active or exercising: walking.   3.  Medication compliance: compliant all of the time.  Interval history  Initial visit today.  Patient presented to establish endocrinology care for relatively newly diagnosed type 2 diabetes mellitus.  He is accompanied by wife in the clinic today.  He has made significant improvement on diet after diagnosis of diabetes in July.  Hemoglobin A1c improved to 5.7% today.  Congratulated him.  Patient has kidney stone currently following with urology and plan for lithotripsy in couple of weeks.  He has normal serum calcium.  Discussed regarding checking parathyroid hormone, 25 urine calcium test.  If not completed by urology will check these labs in the future follow-up visit.  REVIEW OF SYSTEMS As per history of present illness.   PAST MEDICAL HISTORY: Past Medical History:  Diagnosis Date   Diabetes mellitus without complication (HCC)    Hypertension     PAST SURGICAL HISTORY: Past Surgical History:  Procedure Laterality Date   WISDOM TOOTH EXTRACTION     WRIST SURGERY  ALLERGIES: Allergies  Allergen Reactions   Penicillins Itching    FAMILY HISTORY:  Family History  Problem Relation Age of Onset   Hypertension Father    Hyperlipidemia Father    Colon cancer Neg Hx    Rectal cancer Neg Hx    Stomach cancer Neg Hx    Esophageal cancer Neg Hx     SOCIAL HISTORY: Social History   Socioeconomic History   Marital status: Married    Spouse name: Not on file   Number of children: Not on file   Years of education: Not on file   Highest education level: Not on file  Occupational History   Not on file  Tobacco Use   Smoking status: Former    Current  packs/day: 0.00    Types: Cigarettes    Quit date: 04/17/2018    Years since quitting: 5.6   Smokeless tobacco: Never   Tobacco comments:    three quarters of a pack per day.   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 Has 1 step-son aged 61 (in 2025). Former smoker, non-drinker.   Social Drivers of Corporate Investment Banker Strain: Not on file  Food Insecurity: Not on file  Transportation Needs: Not on file  Physical Activity: Not on file  Stress: Not on file  Social Connections: Not on file    MEDICATIONS:  Current Outpatient Medications  Medication Sig Dispense Refill   tirzepatide (MOUNJARO) 2.5 MG/0.5ML Pen Inject 2.5 mg into the skin once a week. 2 mL 0   tirzepatide (MOUNJARO) 5 MG/0.5ML Pen Inject 5 mg into the skin once a week. After completion of 4 weeks of 2.5mg  dose. 6 mL 4   ALPRAZolam  (XANAX ) 0.5 MG tablet TAKE ONE TABLET BY MOUTH DAILY AT BEDTIME AS NEEDED FOR ANXIETY 90 tablet 1   amLODipine  (NORVASC ) 5 MG tablet Take 1 tablet (5 mg total) by mouth daily. 90 tablet 1   Blood Glucose Monitoring Suppl DEVI 1 each by Does not apply route in the morning, at noon, and at bedtime. May substitute to any manufacturer covered by patient's insurance. Check glucose three times a day. 1 each 0   fenofibrate  160 MG tablet TAKE ONE TABLET BY MOUTH ONCE A DAY 90 tablet 0   fenofibrate  160 MG tablet Take 1 tablet (160 mg total) by mouth daily. 90 tablet 0   glucose blood test strip Use as instructed 100 each 12   hydrochlorothiazide  (HYDRODIURIL ) 25 MG tablet Take 1 tablet (25 mg total) by mouth daily 90 tablet 1   HYDROcodone -acetaminophen (NORCO) 10-325 MG tablet One tab by mouth every 6-8 hours as needed for abdominal pain 15 tablet 0    Lancets Misc. MISC May substitute to any manufacturer covered by patient's insurance. Check glucose three times a day 100 each 11   losartan  (COZAAR ) 100 MG tablet Take 1 tablet (100 mg total) by mouth daily. 90 tablet 1   potassium chloride  SA (KLOR-CON  M) 20 MEQ tablet Take 1 tablet (20 mEq total) by mouth daily 90 tablet 1   No current facility-administered medications for this visit.    PHYSICAL EXAM: Vitals:   12/14/23 1450  BP: 104/72  Pulse: ROLLEN)  59  Resp: 16  SpO2: 96%  Weight: 249 lb 12.8 oz (113.3 kg)  Height: 5' 10.5 (1.791 m)   Body mass index is 35.34 kg/m.  Wt Readings from Last 3 Encounters:  12/14/23 249 lb 12.8 oz (113.3 kg)  12/01/23 246 lb (111.6 kg)  11/04/23 244 lb (110.7 kg)    General: Well developed, well nourished male in no apparent distress.  HEENT: AT/[place], no external lesions.  Eyes: Conjunctiva clear and no icterus. Neck: Neck supple  Lungs: Respirations not labored Neurologic: Alert, oriented, normal speech Extremities / Skin: Dry.  Psychiatric: Does not appear depressed or anxious  Diabetic Foot Exam - Simple   No data filed    LABS Reviewed Lab Results  Component Value Date   HGBA1C 5.7 (A) 12/14/2023   HGBA1C 8.5 (H) 08/15/2023   HGBA1C 5.9 (H) 12/21/2021   No results found for: FRUCTOSAMINE Lab Results  Component Value Date   CHOL 163 08/15/2023   HDL 44 08/15/2023   LDLCALC 95 08/15/2023   TRIG 140 08/15/2023   CHOLHDL 3.7 08/15/2023   Lab Results  Component Value Date   MICRALBCREAT 5 08/15/2023   Lab Results  Component Value Date   CREATININE 1.00 11/22/2023   No results found for: GFR  ASSESSMENT / PLAN  1. Controlled type 2 diabetes mellitus without complication, without long-term current use of insulin (HCC)     Diabetes Mellitus type 2, complicated by no known complications. - Diabetic status / severity: Controlled, improved.  Lab Results  Component Value Date   HGBA1C 5.7 (A) 12/14/2023    -  Hemoglobin A1c goal : <6.5%  Discussed about type 2 diabetes mellitus and potential chronic diabetic complications including diabetic retinopathy, neuropathy and nephropathy.  Discussed about importance of maintaining control diabetes mellitus.  Discussed that most important is diet control.  He has made significant improvement on diet especially stopped drinking sodas and cut down on breads.  Diabetes control improved with A1c of 8.5 to 5.7% today.  Congratulated him.  Adjusted diabetes regimen as follows.  He has no contraindication for GLP-1 receptor agonist.  Will start GLP-1 receptor agonist/Mounjaro to help weight loss benefit and maintain the controlled type 2 diabetes mellitus.  - Medications: See below.  I) start Mounjaro 2.5 mg weekly for 4 weeks and increase to 5 mg weekly.  Discussed potential side effect of Mounjaro.  II) stop Januvia .  - Home glucose testing: In the morning fasting and at bedtime or at least few times a week.  - Discussed/ Gave Hypoglycemia treatment plan.  # Consult : Offered dietitian referral.  Patient declined.  Wife works in the healthcare , aware regarding diet plan. # Annual urine for microalbuminuria/ creatinine ratio, no microalbuminuria currently, continue ACE/ARB losartan . Last  Lab Results  Component Value Date   MICRALBCREAT 5 08/15/2023    # Foot check nightly.  # Annual dilated diabetic eye exams.  Advised to have diabetic eye exam.  Patient will make appointment at eye clinic.  - Diet: Eat reasonable portion sizes to promote a healthy weight, discussed in detail. - Life style / activity / exercise: Discussed.  2. Blood pressure  -  BP Readings from Last 1 Encounters:  12/14/23 104/72    - Control is in target.  - No change in current plans.  3. Lipid status / Hyperlipidemia - Last  Lab Results  Component Value Date   LDLCALC 95 08/15/2023   - Consider statin therapy.  Currently on fenofibrate .  Diagnoses and all  orders for this visit:  Controlled type 2 diabetes mellitus without complication, without long-term current use of insulin (HCC) -     POCT glycosylated hemoglobin (Hb A1C) -     tirzepatide (MOUNJARO) 2.5 MG/0.5ML Pen; Inject 2.5 mg into the skin once a week. -     tirzepatide (MOUNJARO) 5 MG/0.5ML Pen; Inject 5 mg into the skin once a week. After completion of 4 weeks of 2.5mg  dose.    DISPOSITION Follow up in clinic in 3  months suggested.   All questions answered and patient verbalized understanding of the plan.  Jesus Kettlewell, MD [facility] [address] [place], [address] [place], [place] [address] Phone # [phone]  At least part of this note was generated using voice recognition software. Inadvertent word errors may have occurred, which were not recognized during the proofreading process.

## 2023-12-14 NOTE — Patient Instructions (Addendum)
 Latest Reference Range & Units 12/21/21 09:36 08/15/23 09:10 12/14/23 15:00  Hemoglobin A1C 4.0 - 5.6 % 5.9 (H) 8.5 (H) 5.7 !  (H): Data is abnormally high !: Data is abnormal  Mounjaro 2.5mg  weekly for 4 weeks and increase to 5mg  weekly.

## 2023-12-15 ENCOUNTER — Telehealth: Payer: Self-pay | Admitting: Pharmacy Technician

## 2023-12-15 ENCOUNTER — Other Ambulatory Visit (HOSPITAL_COMMUNITY): Payer: Self-pay

## 2023-12-15 DIAGNOSIS — E119 Type 2 diabetes mellitus without complications: Secondary | ICD-10-CM

## 2023-12-15 NOTE — Telephone Encounter (Signed)
 Pharmacy Patient Advocate Encounter   Received notification from CoverMyMeds that prior authorization for Mounjaro 2.5MG /0.5ML auto-injectors  is required/requested.   Insurance verification completed.   The patient is insured through [facility]. Key: [id number]  Prior Authorization form/request asks a question that requires your assistance. Please see the question below and advise accordingly. The PA will not be submitted until the necessary information is received.

## 2023-12-16 MED ORDER — SITAGLIPTIN PHOSPHATE 50 MG PO TABS: 50.0000 mg | ORAL_TABLET | Freq: Every day | ORAL | 3 refills | Status: AC

## 2023-12-16 NOTE — Telephone Encounter (Signed)
 Patient given advisement regarding medication. Patient states an understanding. No further questions at this time from patient.

## 2023-12-16 NOTE — Telephone Encounter (Signed)
 Hey and thank you! Metformin, or a drug containing metformin, is a required step therapy to avoid denial unless there is clinical rationale as to why he cannot take metformin. Please advise.

## 2023-12-16 NOTE — Telephone Encounter (Signed)
 Please inform the patient that we need to try metformin before he can get approved medical insurance for one of the GLP-1 receptor agonist medications.  As he has plan for lithotripsy in the near future I would not try for metformin until he get all better after the lithotripsy.  In the meantime he can continue to take Januvia  as previously 50 mg daily, after he is done with his kidney stone treatment we will plan for metformin and after that we will try again for GLP-1 receptor agonist medication.  Camy Leder, MD [facility]in[facility] [address] [place], [address] [place], [place] [address] Phone # [phone]

## 2023-12-18 ENCOUNTER — Other Ambulatory Visit: Payer: Self-pay | Admitting: Internal Medicine

## 2023-12-18 DIAGNOSIS — E781 Pure hyperglyceridemia: Secondary | ICD-10-CM

## 2023-12-21 ENCOUNTER — Encounter (HOSPITAL_COMMUNITY): Payer: Self-pay | Admitting: Urology

## 2023-12-21 NOTE — Progress Notes (Signed)
 LITHO PREOP PHONE CALL   ALLERGIES REVIEWED: YES  MEDICATION REVIEW DONE: YES MEDICATIONS THAT PT SHOULD HOLD (LIST): NSAIDs hold 48hr, Confirmed not on Mounjaro only taking Januvia  oral- told to hold am of procedure  CAN PT WALK UP STAIRS WITHOUT SHORTNESS OF BREATH: YES HOME O2: NO CPAP: NO  IF YES, INFORMED PT TO BRING CPAP WITH TUBING AND MASK:YES/NO   INFORMED DRIVER NEEDED FOR PROCEDURE: YES   PT WAS GIVEN BLUE FOLDER AT UROLOGY APPT: YES PT INFORMED TO BRING BLUE FOLDER WITH ALL CONTENTS: YES  REVIEWED ARRIVAL TIME AND LOCATION: YES  OTHER PERTINENT INFORMATION:

## 2023-12-26 ENCOUNTER — Encounter (HOSPITAL_COMMUNITY): Payer: Self-pay | Admitting: Urology

## 2023-12-26 ENCOUNTER — Ambulatory Visit (HOSPITAL_COMMUNITY)
Admission: RE | Admit: 2023-12-26 | Discharge: 2023-12-26 | Disposition: A | Discharge status: Home / Self Care | Attending: Urology | Admitting: Urology

## 2023-12-26 ENCOUNTER — Other Ambulatory Visit: Payer: Self-pay

## 2023-12-26 ENCOUNTER — Ambulatory Visit (HOSPITAL_COMMUNITY)

## 2023-12-26 ENCOUNTER — Encounter (HOSPITAL_COMMUNITY)
Admission: RE | Disposition: A | Discharge status: Home / Self Care | Payer: Self-pay | Source: Home / Self Care | Attending: Urology

## 2023-12-26 DIAGNOSIS — E119 Type 2 diabetes mellitus without complications: Secondary | ICD-10-CM | POA: Diagnosis not present

## 2023-12-26 DIAGNOSIS — N200 Calculus of kidney: Secondary | ICD-10-CM | POA: Diagnosis not present

## 2023-12-26 DIAGNOSIS — I10 Essential (primary) hypertension: Secondary | ICD-10-CM | POA: Diagnosis not present

## 2023-12-26 DIAGNOSIS — N201 Calculus of ureter: Secondary | ICD-10-CM | POA: Diagnosis not present

## 2023-12-26 HISTORY — PX: EXTRACORPOREAL SHOCK WAVE LITHOTRIPSY: SHX1557

## 2023-12-26 LAB — GLUCOSE, CAPILLARY: Glucose-Capillary: 106 mg/dL — ABNORMAL HIGH (ref 70–99)

## 2023-12-26 SURGERY — LITHOTRIPSY, ESWL
Anesthesia: LOCAL | Laterality: Left

## 2023-12-26 MED ORDER — SODIUM CHLORIDE 0.9% FLUSH: 3.0000 mL | Freq: Two times a day (BID) | INTRAVENOUS | Status: DC

## 2023-12-26 MED ORDER — CIPROFLOXACIN HCL 500 MG PO TABS
500.0000 mg | ORAL_TABLET | ORAL | Status: AC
  Administered 2023-12-26: 500 mg via ORAL
  Filled 2023-12-26: qty 1

## 2023-12-26 MED ORDER — SODIUM CHLORIDE 0.9 % IV SOLN: INTRAVENOUS | Status: DC

## 2023-12-26 MED ORDER — TAMSULOSIN HCL 0.4 MG PO CAPS
0.4000 mg | ORAL_CAPSULE | Freq: Every day | ORAL | 1 refills | Status: AC
Start: 2023-12-26 — End: ?

## 2023-12-26 MED ORDER — MORPHINE SULFATE (PF) 2 MG/ML IV SOLN: 2.0000 mg | INTRAVENOUS | Status: DC | PRN

## 2023-12-26 MED ORDER — HYDROCODONE-ACETAMINOPHEN 10-325 MG PO TABS: ORAL_TABLET | ORAL | 0 refills | Status: AC

## 2023-12-26 MED ORDER — OXYCODONE HCL 5 MG PO TABS: 5.0000 mg | ORAL_TABLET | ORAL | Status: DC | PRN

## 2023-12-26 MED ORDER — ACETAMINOPHEN 325 MG PO TABS: 650.0000 mg | ORAL_TABLET | ORAL | Status: DC | PRN

## 2023-12-26 MED ORDER — SODIUM CHLORIDE 0.9% FLUSH: 3.0000 mL | INTRAVENOUS | Status: DC | PRN

## 2023-12-26 MED ORDER — DIAZEPAM 5 MG PO TABS
10.0000 mg | ORAL_TABLET | ORAL | Status: AC
  Administered 2023-12-26: 10 mg via ORAL
  Filled 2023-12-26: qty 2

## 2023-12-26 MED ORDER — ACETAMINOPHEN 650 MG RE SUPP: 650.0000 mg | RECTAL | Status: DC | PRN

## 2023-12-26 MED ORDER — SODIUM CHLORIDE 0.9 % IV SOLN: 250.0000 mL | INTRAVENOUS | Status: DC | PRN

## 2023-12-26 MED ORDER — DIPHENHYDRAMINE HCL 25 MG PO CAPS
25.0000 mg | ORAL_CAPSULE | ORAL | Status: AC
  Administered 2023-12-26: 25 mg via ORAL
  Filled 2023-12-26: qty 1

## 2023-12-26 NOTE — Interval H&P Note (Signed)
 History and Physical Interval Note:  No change in stone  12/26/2023 7:43 AM  Muzamil Krystle Oberman  has presented today for surgery, with the diagnosis of LEFT URETERAL STONE.  The various methods of treatment have been discussed with the patient and family. After consideration of risks, benefits and other options for treatment, the patient has consented to  Procedure(s): LITHOTRIPSY, ESWL (Left) as a surgical intervention.  The patient's history has been reviewed, patient examined, no change in status, stable for surgery.  I have reviewed the patient's chart and labs.  Questions were answered to the patient's satisfaction.     Aryiana Klinkner

## 2023-12-26 NOTE — Op Note (Signed)
See [place] note

## 2023-12-27 ENCOUNTER — Encounter (HOSPITAL_COMMUNITY): Payer: Self-pay | Admitting: Urology

## 2024-01-02 ENCOUNTER — Emergency Department (HOSPITAL_COMMUNITY)

## 2024-01-02 ENCOUNTER — Encounter (HOSPITAL_COMMUNITY): Payer: Self-pay

## 2024-01-02 ENCOUNTER — Other Ambulatory Visit: Payer: Self-pay

## 2024-01-02 ENCOUNTER — Emergency Department (HOSPITAL_COMMUNITY)
Admission: EM | Admit: 2024-01-02 | Discharge: 2024-01-02 | Disposition: A | Discharge status: Home / Self Care | Attending: Emergency Medicine | Admitting: Emergency Medicine

## 2024-01-02 DIAGNOSIS — N132 Hydronephrosis with renal and ureteral calculous obstruction: Secondary | ICD-10-CM | POA: Insufficient documentation

## 2024-01-02 DIAGNOSIS — K5730 Diverticulosis of large intestine without perforation or abscess without bleeding: Secondary | ICD-10-CM | POA: Diagnosis not present

## 2024-01-02 DIAGNOSIS — Z79899 Other long term (current) drug therapy: Secondary | ICD-10-CM | POA: Insufficient documentation

## 2024-01-02 DIAGNOSIS — I10 Essential (primary) hypertension: Secondary | ICD-10-CM | POA: Diagnosis not present

## 2024-01-02 DIAGNOSIS — R10A2 Flank pain, left side: Secondary | ICD-10-CM | POA: Diagnosis not present

## 2024-01-02 DIAGNOSIS — N200 Calculus of kidney: Secondary | ICD-10-CM

## 2024-01-02 DIAGNOSIS — E1165 Type 2 diabetes mellitus with hyperglycemia: Secondary | ICD-10-CM | POA: Diagnosis not present

## 2024-01-02 DIAGNOSIS — D72829 Elevated white blood cell count, unspecified: Secondary | ICD-10-CM | POA: Insufficient documentation

## 2024-01-02 DIAGNOSIS — Z7984 Long term (current) use of oral hypoglycemic drugs: Secondary | ICD-10-CM | POA: Insufficient documentation

## 2024-01-02 DIAGNOSIS — N281 Cyst of kidney, acquired: Secondary | ICD-10-CM | POA: Diagnosis not present

## 2024-01-02 LAB — URINALYSIS, ROUTINE W REFLEX MICROSCOPIC
Bacteria, UA: NONE SEEN
Bilirubin Urine: NEGATIVE
Glucose, UA: NEGATIVE mg/dL
Ketones, ur: NEGATIVE mg/dL
Leukocytes,Ua: NEGATIVE
Nitrite: NEGATIVE
Protein, ur: NEGATIVE mg/dL
Specific Gravity, Urine: 1.017 (ref 1.005–1.030)
pH: 6 (ref 5.0–8.0)

## 2024-01-02 LAB — CBC
HCT: 44 % (ref 39.0–52.0)
Hemoglobin: 14.9 g/dL (ref 13.0–17.0)
MCH: 27.7 pg (ref 26.0–34.0)
MCHC: 33.9 g/dL (ref 30.0–36.0)
MCV: 81.9 fL (ref 80.0–100.0)
Platelets: 294 K/uL (ref 150–400)
RBC: 5.37 MIL/uL (ref 4.22–5.81)
RDW: 13.9 % (ref 11.5–15.5)
WBC: 13.3 K/uL — ABNORMAL HIGH (ref 4.0–10.5)
nRBC: 0 % (ref 0.0–0.2)

## 2024-01-02 LAB — BASIC METABOLIC PANEL WITH GFR
Anion gap: 11 (ref 5–15)
BUN: 19 mg/dL (ref 6–20)
CO2: 24 mmol/L (ref 22–32)
Calcium: 9.9 mg/dL (ref 8.9–10.3)
Chloride: 100 mmol/L (ref 98–111)
Creatinine, Ser: 1 mg/dL (ref 0.61–1.24)
GFR, Estimated: >60 mL/min (ref >60)
Glucose, Bld: 147 mg/dL — ABNORMAL HIGH (ref 70–99)
Potassium: 3.6 mmol/L (ref 3.5–5.1)
Sodium: 135 mmol/L (ref 135–145)

## 2024-01-02 MED ORDER — MORPHINE SULFATE (PF) 4 MG/ML IV SOLN
4.0000 mg | Freq: Once | INTRAVENOUS | Status: AC
  Administered 2024-01-02: 4 mg via INTRAVENOUS
  Filled 2024-01-02: qty 1

## 2024-01-02 MED ORDER — OXYCODONE-ACETAMINOPHEN 5-325 MG PO TABS: 1.0000 | ORAL_TABLET | Freq: Four times a day (QID) | ORAL | 0 refills | Status: AC | PRN

## 2024-01-02 MED ORDER — ONDANSETRON HCL 4 MG/2ML IJ SOLN: 4.0000 mg | Freq: Once | INTRAMUSCULAR | Status: DC | PRN

## 2024-01-02 MED ORDER — ONDANSETRON 4 MG PO TBDP: 4.0000 mg | ORAL_TABLET | Freq: Four times a day (QID) | ORAL | 0 refills | Status: AC | PRN

## 2024-01-02 MED ORDER — KETOROLAC TROMETHAMINE 15 MG/ML IJ SOLN
15.0000 mg | Freq: Once | INTRAMUSCULAR | Status: AC
  Administered 2024-01-02: 15 mg via INTRAVENOUS
  Filled 2024-01-02: qty 1

## 2024-01-02 NOTE — ED Triage Notes (Signed)
 Pt is coming in for post op pain in the flank after recently having a lithotripsy, started having the flank pain yesterday, and it continued into this morning, some hematuria. Pt is otherwise stable and has taken pain medications prior to arrival with no pain relief.

## 2024-01-02 NOTE — Discharge Instructions (Addendum)
You were seen in the emergency department and found to have a kidney stone.  Please take ibuprofen as needed for pain.  We are sending you home with multiple medications to assist with passing the stone and for residual pain/nausea:  -Flomax-this is a medication to help pass the stone, it allows urine to exit the body more freely.  Please take this once daily with a meal.  -Percocet-this is a narcotic/controlled substance medication that has potential addicting qualities.  We recommend that you take 1-2 tablets every 6 hours as needed for severe pain.  Do not drive or operate heavy machinery when taking this medicine as it can be sedating. Do not drink alcohol or take other sedating medications when taking this medicine for safety reasons.  Keep this out of reach of small children.  Please be aware this medicine has Tylenol in it (325 mg/tab) do not exceed the maximum dose of Tylenol in a day per over the counter recommendations should you decide to supplement with Tylenol over the counter.   -Zofran-this is an antinausea medication, you may take this every 8 hours as needed for nausea and vomiting, please allow the tablet to dissolve underneath of your tongue.   We have prescribed you new medication(s) today. Discuss the medications prescribed today with your pharmacist as they can have adverse effects and interactions with your other medicines including over the counter and prescribed medications. Seek medical evaluation if you start to experience new or abnormal symptoms after taking one of these medicines, seek care immediately if you start to experience difficulty breathing, feeling of your throat closing, facial swelling, or rash as these could be indications of a more serious allergic reaction  Please follow-up with the urology group provided in your discharge instructions within 3 to 5 days.  Return to the ER for new or worsening symptoms including but not limited to worsening pain not controlled  by these medicines, inability to keep fluids down, fever, or any other concerns that you may have.

## 2024-01-02 NOTE — ED Provider Notes (Signed)
 [place] EMERGENCY DEPARTMENT AT [facility] Provider Note   CSN: [id number] Arrival date & time: 01/02/24  9370     Patient presents with: Flank Pain   Jesus Roy is a 48 y.o. male with past medical history significant for obesity, diabetes, hypertension, kidney stones, who presents with flank pain on left after having lithotripsy on 11/24. Endorses hematuria. No significant relief with home norco and alleve. Denies nausea. Denies fever, dysuria. Rates pain 8/10.     Flank Pain       Prior to Admission medications   Medication Sig Start Date End Date Taking? Authorizing Provider  ondansetron (ZOFRAN-ODT) 4 MG disintegrating tablet Take 1 tablet (4 mg total) by mouth every 6 (six) hours as needed for nausea or vomiting. 01/02/24  Yes Regene Mccarthy H, PA-C  oxyCODONE -acetaminophen  (PERCOCET/ROXICET) 5-325 MG tablet Take 1 tablet by mouth every 6 (six) hours as needed for severe pain (pain score 7-10). 01/02/24  Yes Pranay Hilbun H, PA-C  ALPRAZolam  (XANAX ) 0.5 MG tablet TAKE ONE TABLET BY MOUTH DAILY AT BEDTIME AS NEEDED FOR ANXIETY 10/24/23   Perri Ronal PARAS, MD  amLODipine  (NORVASC ) 5 MG tablet Take 1 tablet (5 mg total) by mouth daily. 11/21/23   Perri Ronal PARAS, MD  Blood Glucose Monitoring Suppl DEVI 1 each by Does not apply route in the morning, at noon, and at bedtime. May substitute to any manufacturer covered by patient's insurance. Check glucose three times a day. 08/19/23   Perri Ronal PARAS, MD  fenofibrate  160 MG tablet TAKE ONE TABLET BY MOUTH ONCE A DAY 05/30/23   Perri Ronal PARAS, MD  fenofibrate  160 MG tablet TAKE ONE TABLET BY MOUTH ONCE A DAY 12/19/23   Perri Ronal PARAS, MD  glucose blood test strip Use as instructed 08/19/23   Perri Ronal PARAS, MD  hydrochlorothiazide  (HYDRODIURIL ) 25 MG tablet Take 1 tablet (25 mg total) by mouth daily 11/21/23   Perri Ronal PARAS, MD  HYDROcodone -acetaminophen  [facility]) 10-325 MG tablet One tab by mouth every 6-8 hours as  needed for abdominal pain 12/26/23   Watt Rush, MD  Lancets Misc. MISC May substitute to any manufacturer covered by patient's insurance. Check glucose three times a day 08/19/23   Perri Ronal PARAS, MD  losartan  (COZAAR ) 100 MG tablet Take 1 tablet (100 mg total) by mouth daily. 01/13/23   Perri Ronal PARAS, MD  potassium chloride  SA (KLOR-CON  M) 20 MEQ tablet Take 1 tablet (20 mEq total) by mouth daily 11/21/23   Perri Ronal PARAS, MD  sitaGLIPtin  (JANUVIA ) 50 MG tablet Take 1 tablet (50 mg total) by mouth daily. 12/16/23   Thapa, [place], MD  tamsulosin  (FLOMAX ) 0.4 MG CAPS capsule Take 1 capsule (0.4 mg total) by mouth daily. 12/26/23   Watt Rush, MD  tirzepatide  (MOUNJARO ) 2.5 MG/0.5ML Pen Inject 2.5 mg into the skin once a week. 12/14/23   Thapa, [place], MD    Allergies: Penicillins    Review of Systems  Genitourinary:  Positive for flank pain.  All other systems reviewed and are negative.   Updated Vital Signs BP (!) 159/93 (BP Location: Left Arm)   Pulse 79   Temp 97.7 F (36.5 C) (Oral)   Resp 18   SpO2 100%   Physical Exam Vitals and nursing note reviewed.  Constitutional:      General: He is not in acute distress.    Appearance: Normal appearance.  HENT:     Head: Normocephalic and atraumatic.  Eyes:  General:        Right eye: No discharge.        Left eye: No discharge.  Cardiovascular:     Rate and Rhythm: Normal rate and regular rhythm.     Heart sounds: No murmur heard.    No friction rub. No gallop.  Pulmonary:     Effort: Pulmonary effort is normal.     Breath sounds: Normal breath sounds.  Abdominal:     General: Bowel sounds are normal.     Palpations: Abdomen is soft.     Comments: Left sided flank pain, no rebound, rigidity, guarding, positive CVA tenderness on left  Skin:    General: Skin is warm and dry.     Capillary Refill: Capillary refill takes less than 2 seconds.  Neurological:     Mental Status: He is alert and oriented to person, place, and  time.  Psychiatric:        Mood and Affect: Mood normal.        Behavior: Behavior normal.     (all labs ordered are listed, but only abnormal results are displayed) Labs Reviewed  URINALYSIS, ROUTINE W REFLEX MICROSCOPIC - Abnormal; Notable for the following components:      Result Value   Hgb urine dipstick SMALL (*)    All other components within normal limits  BASIC METABOLIC PANEL WITH GFR - Abnormal; Notable for the following components:   Glucose, Bld 147 (*)    All other components within normal limits  CBC - Abnormal; Notable for the following components:   WBC 13.3 (*)    All other components within normal limits    EKG: None  Radiology: CT Renal Stone Study Result Date: 01/02/2024 EXAM: CT UROGRAM 01/02/2024 07:27:50 AM TECHNIQUE: CT of the abdomen and pelvis was performed without intravenous contrast as per CT urogram protocol. Multiplanar reformatted images as well as MIP urogram images are provided for review. Automated exposure control, iterative reconstruction, and/or weight based adjustment of the mA/kV was utilized to reduce the radiation dose to as low as reasonably achievable. COMPARISON: CT without contrast 11/23/2023. CLINICAL HISTORY: Left flank pain following lithotripsy on 12/26/2023. FINDINGS: LOWER CHEST: No acute abnormality. LIVER: The liver measures 19 cm in length with mild to moderate steatosis. No mass is seen without contrast. GALLBLADDER AND BILE DUCTS: Gallbladder is unremarkable. No biliary ductal dilatation. SPLEEN: No acute abnormality. PANCREAS: No acute abnormality. ADRENAL GLANDS: There is no left adrenal mass. A 1.5 cm right adrenal myelolipoma is again noted. KIDNEYS, URETERS AND BLADDER: There is a 1.9 cm Bosniak 2 cyst in the superior pole of the left kidney, Hounsfield density is 23. No follow-up imaging is recommended. No other contour-forming lesion of either of the unenhanced kidneys is seen. Right Kidney: No nephrolithiasis or  hydronephrosis on the right. No perinephric or periureteral stranding. Left Kidney: Interval worsening of renal and perirenal edema and moderate hydroureteronephrosis. The prior study demonstrated a 10 mm calyceal stone in the inferior pole of the left kidney and a 5 mm stone in the proximal ureter at L3-L4. Today, there are several punctate up to 2 mm calyceal stones in the lower pole of this kidney, which are likely fragments of the larger stone previously there. Just proximal to the UVJ, there is a 5 mm stone which may or may not be the stone that was in the proximal ureter previously. About 1 to 2 cm above this, there is a linear cluster of several stones measuring 2 to  3 mm. No other stones are seen in the left ureter. Bladder: No stones in the bladder. The bladder is unremarkable for the degree of distention. GI AND BOWEL: Stomach demonstrates no acute abnormality. No bowel obstruction or inflammation. Normal appendix. Advanced sigmoid diverticulosis without evidence of diverticulitis. PERITONEUM AND RETROPERITONEUM: There is a small inguinal fat hernia. No free fluid, free hemorrhage, or free air. VASCULATURE: Aorta is normal in caliber. There is mild aortic atherosclerosis without aneurysm. LYMPH NODES: There are loosely clustered mildly prominent lymph nodes in the right lower quadrant. The largest of these is 1.3 cm in short axis. No new or progressive adenopathy. No further adenopathy is seen. REPRODUCTIVE ORGANS: No acute abnormality. BONES AND SOFT TISSUES: No acute osseous abnormality. No focal soft tissue abnormality. IMPRESSION: 1. Interval worsening of left renal and perirenal edema with moderate increasing hydroureteronephrosis, now with multiple small distal ureteral stones largest is 5 mm. Correlate clinically for infectious complication. 2. Residual nonobstructing left lower pole renal calculi measuring up to 2 mm, likely fragments a larger stone previously in this location . 3. Advanced sigmoid  diverticulosis without evidence of diverticulitis. 4. Mild aortic atherosclerosis without aneurysm. 5. Right adrenal myelolipoma. Electronically signed by: Francis Quam MD 01/02/2024 07:51 AM EST RP Workstation: [id number]     Procedures   Medications Ordered in the ED  ondansetron [facility]) injection 4 mg (has no administration in time range)  ketorolac (TORADOL) 15 MG/ML injection 15 mg (15 mg Intravenous Given 01/02/24 0657)  morphine  (PF) 4 MG/ML injection 4 mg (4 mg Intravenous Given 01/02/24 0657)                                    Medical Decision Making Amount and/or Complexity of Data Reviewed Labs: ordered. Radiology: ordered.  Risk Prescription drug management.   This patient is a 48 y.o. male  who presents to the ED for concern of flank pain.   Differential diagnoses prior to evaluation: The emergent differential diagnosis includes, but is not limited to,  AAA, renal vascular thrombosis, mesenteric ischemia, pyelonephritis, nephrolithiasis, cystitis, biliary colic, pancreatitis, PUD, appendicitis, diverticulitis, bowel obstruction -- in context of recent lithotripsy considered renal hematoma, but overall suspect patient is passing stones after lithotripsy . This is not an exhaustive differential.   Past Medical History / Co-morbidities / Social History: obesity, diabetes, hypertension, kidney stones  Additional history: Chart reviewed. Pertinent results include: reviewed recent lithotripsy procedure  Physical Exam: Physical exam performed. The pertinent findings include: Moderate hypertension, blood pressure 159/93, left-sided flank pain with left-sided CVA tenderness, no rebound, rigidity, guarding.  Vital signs otherwise stable.  Lab Tests/Imaging studies: I personally interpreted labs/imaging and the pertinent results include: CBC notable for mild leukocytosis, white blood cells 13.3, BMP overall unremarkable other than glucose of 147, UA with no evidence of  infection, small hemoglobin noted consistent with recent lithotripsy CT renal stone study shows  1. Interval worsening of left renal and perirenal edema with moderate  increasing hydroureteronephrosis, now with multiple small distal ureteral  stones largest is 5 mm. Correlate clinically for infectious complication.  2. Residual nonobstructing left lower pole renal calculi measuring up to 2 mm,  likely fragments a larger stone previously in this location .  3. Advanced sigmoid diverticulosis without evidence of diverticulitis.  4. Mild aortic atherosclerosis without aneurysm.  5. Right adrenal myelolipoma.  . I agree with the radiologist interpretation.  Consults: Spoke with urology,  Cam Sattenfield, PA-C who reports okay for oral pain control, close urology follow-up, return precautions for worsening pain / no improvement   Medications: I ordered medication including toradol , morphine  for pain.  I have reviewed the patients home medicines and have made adjustments as needed.   Disposition: After consideration of the diagnostic results and the patients response to treatment, I feel that patient with left-sided flank pain from obstructing 5 mm stone status post lithotripsy.  Kidney function stable, no infectious symptoms present, patient is nearly out of his home pain medication, I think reasonable to refill at this time, will discharge with pain medication, nausea medication, encourage continued fluid intake, Flomax , and close urology follow-up or emergency department return as needed.   emergency department workup does not suggest an emergent condition requiring admission or immediate intervention beyond what has been performed at this time. The plan is: as above. The patient is safe for discharge and has been instructed to return immediately for worsening symptoms, change in symptoms or any other concerns.   Final diagnoses:  Kidney stone    ED Discharge Orders          Ordered     oxyCODONE -acetaminophen  (PERCOCET/ROXICET) 5-325 MG tablet  Every 6 hours PRN        01/02/24 0820    ondansetron  (ZOFRAN -ODT) 4 MG disintegrating tablet  Every 6 hours PRN        01/02/24 0820               Cardale Dorer, Sherlean DEL, PA-C 01/02/24 9176    Jerral Meth, MD 01/02/24 2301

## 2024-01-06 ENCOUNTER — Other Ambulatory Visit: Payer: Self-pay | Admitting: Internal Medicine

## 2024-01-06 DIAGNOSIS — I10 Essential (primary) hypertension: Secondary | ICD-10-CM

## 2024-01-23 DIAGNOSIS — N201 Calculus of ureter: Secondary | ICD-10-CM | POA: Diagnosis not present

## 2024-01-23 DIAGNOSIS — E119 Type 2 diabetes mellitus without complications: Secondary | ICD-10-CM | POA: Diagnosis not present

## 2024-03-15 ENCOUNTER — Ambulatory Visit: Admitting: Endocrinology
